# Patient Record
Sex: Female | Born: 1965 | Race: White | Hispanic: No | Marital: Married | Smoking: Never smoker
Health system: Southern US, Community
[De-identification: ages and names within clinical notes are randomized; demographics above are authoritative.]

## PROBLEM LIST (undated history)

## (undated) DIAGNOSIS — D6800 Von Willebrand disease, unspecified: Secondary | ICD-10-CM

## (undated) HISTORY — PX: ABDOMINAL HYSTERECTOMY: SHX81

## (undated) HISTORY — PX: KNEE SURGERY: SHX244

---

## 1998-03-12 ENCOUNTER — Inpatient Hospital Stay (HOSPITAL_COMMUNITY): Admission: AD | Admit: 1998-03-12 | Discharge: 1998-03-12 | Payer: Self-pay | Admitting: Obstetrics and Gynecology

## 1999-07-21 ENCOUNTER — Other Ambulatory Visit: Admission: RE | Admit: 1999-07-21 | Discharge: 1999-07-21 | Payer: Self-pay | Admitting: Obstetrics & Gynecology

## 2004-11-04 ENCOUNTER — Other Ambulatory Visit: Admission: RE | Admit: 2004-11-04 | Discharge: 2004-11-04 | Payer: Self-pay | Admitting: Obstetrics & Gynecology

## 2007-06-03 ENCOUNTER — Ambulatory Visit (HOSPITAL_COMMUNITY): Admission: RE | Admit: 2007-06-03 | Discharge: 2007-06-03 | Payer: Self-pay | Admitting: Obstetrics & Gynecology

## 2007-10-13 ENCOUNTER — Encounter (INDEPENDENT_AMBULATORY_CARE_PROVIDER_SITE_OTHER): Payer: Self-pay | Admitting: Obstetrics & Gynecology

## 2007-10-13 ENCOUNTER — Ambulatory Visit (HOSPITAL_COMMUNITY): Admission: RE | Admit: 2007-10-13 | Discharge: 2007-10-16 | Payer: Self-pay | Admitting: Obstetrics & Gynecology

## 2010-08-12 NOTE — H&P (Signed)
Sydney Bell, Sydney Bell             ACCOUNT NO.:  [id number]   MEDICAL RECORD NO.:  [id number]          PATIENT TYPE:  AMB   LOCATION:  SDC                           FACILITY:  [facility]   PHYSICIAN:  Freddy Finner, M.D.   DATE OF BIRTH:  1965-08-08   DATE OF ADMISSION:  10/13/2007  DATE OF DISCHARGE:                              HISTORY & PHYSICAL   ADMISSION DIAGNOSES:  1. Marked pelvic relaxation with relaxed vaginal outlet.  2. Second-degree cystocele.  3. Second-degree rectocele.  4. Significant uterine descent.  5. Clinical ultrasound and clinical history, findings most consistent      with adenomyosis.   Patient is a 45 year old white married female, gravida 4, para 3, who  has a very long history of menorrhagia, severe dysmenorrhea, recurring  ovarian cyst, intermittent pelvic pain.  She has symptoms of marked  pelvic pressure.  She does have some stress urinary symptoms.  She has a  very long history of very heavy menses.  She also has an intolerance of  oral contraceptives.  She has declined the use of an intrauterine  device.  She has declined endometrial ablation as a way of managing her  dysmenorrhea.   Her first plan to proceed with surgery dates back approximately 10  years, and she has been reluctant over the interval since that time to  be completely definitive with surgery, but her symptoms have reached the  point at this time.  She and her husband are planning no more children  since he has had a vasectomy.  She has now requested definitive surgical  intervention.  She is admitted now for a total vaginal hysterectomy,  anterior and posterior vaginal repair, sacrospinous ligament suspension  of the vagina.  The option of removing the ovaries have been discussed,  and the patient really prefers to keep her ovaries at this point in time  unless some intraoperative finding makes it appropriate to remove one or  both.   The potential risks of the procedure, including  injury to the bladder,  bowel, other intra-abdominal structures, the potential for hemorrhage,  infection, and additional surgical procedures in the event of  complication have been discussed.  The patient has reviewed the ACOG  booklet on this particular procedure and is now admitted and prepared to  proceed with surgery.   Her current review of systems is essentially negative except above.  There are no specific cardiopulmonary, GI, or other GU complaints.   Patient did have a minor operative procedure in February of this year as  an attempt to mediate her symptoms, and she was found on ultrasound to  have an endometrial polyp and thickening of the uterine wall consistent  with adenomyosis.  Because of the pain component of her symptoms, she  also had laparoscopy along with hysteroscopy, D&C, and resection of her  endometrial polyp in February.  She has failed to clinically respond and  is now admitted for more definitive surgery.  Additional operative  procedures include a laparoscopy in 1998 with essentially similar  findings.  She has had three vaginal deliveries.  She has had  a  tonsillectomy many years ago.  She has had previous laparoscopic  evaluation of her knee.  She is known to have von Willebrand's disease.  In the past, she was followed by Dr. Glenford Peers while he was here in  [facility] for vaginal deliveries.  He did not recommend additional  treatment for this other than in the event of hemorrhage, which did not  occur.  Certainly, von Willebrand's may be a contributing factor to her  menorrhagia.   She is currently on no medications on a regular basis.   She has no other medical conditions or illnesses.   She has no known allergies to medications.   She does not do cigarettes.   FAMILY HISTORY:  Noncontributory.  Family history is remarkable for the  complete absence of any history of ovarian cancer.   PHYSICAL EXAMINATION:  HEENT:  Grossly within normal  limits.  Thyroid gland is not palpably enlarged.  Blood pressure in the office 100/60.  Weight 119.  Height 5 feet 1-1/2  inches.  CHEST:  Clear to auscultation.  HEART:  Normal sinus rhythm without murmurs, rubs or gallops.  BREASTS:  Normal.  There are no palpable masses.  No skin change.  No  evidence of nipple discharge (mammogram in May, 2009 was normal).  ABDOMEN:  Soft, nontender without appreciable organomegaly or palpable  masses.  EXTREMITIES:  Without clubbing, cyanosis or edema.  PELVIC:  Basically described in the present illness.  On bimanual, the  uterus is enlarged.  There are no palpable adnexal masses.  On  rectovaginal exam, the rectum is palpably normal and confirms the pelvic  findings noted above.   ASSESSMENT:  1. Symptomatic pelvic relaxation.  2. Uterine enlargement with suspected adenomyosis.  3. Clinical symptoms of chronic pelvic pain, severe      dysmenorrhea/menorrhagia.  4. Pressure symptoms and stress urinary incontinence, consistent with      pelvic relaxation.   PLAN:  Total vaginal hysterectomy, anterior and posterior  colporrhaphies, sacral sling with spinous ligament suspension of vagina.      Freddy Finner, M.D.  Electronically Signed     WRN/MEDQ  D:  10/12/2007  T:  10/12/2007  Job:  [id number]

## 2010-08-12 NOTE — Op Note (Signed)
Sydney Bell, BALDO             ACCOUNT NO.:  [id number]   MEDICAL RECORD NO.:  [id number]          PATIENT TYPE:  OIB   LOCATION:  9315                          FACILITY:  [facility]   PHYSICIAN:  Freddy Finner, M.D.   DATE OF BIRTH:  [birth year]   DATE OF PROCEDURE:  10/13/2007  DATE OF DISCHARGE:                               OPERATIVE REPORT   PREOPERATIVE DIAGNOSES:  1. Symptomatic pelvic relaxation.  2. Uterine enlargement.  3. Persistent menorrhagia.  4. Suspected adenomyosis.   POSTOPERATIVE DIAGNOSES:  1. Symptomatic pelvic relaxation.  2. Uterine enlargement.  3. Persistent menorrhagia.  4. Suspected adenomyosis.   OPERATIVE PROCEDURE:  1. Total vaginal hysterectomy.  2. Anterior and posterior vaginal repair.  3. Sacrospinous ligament suspension of vagina.  4. Suprapubic banana catheter placement.   ESTIMATED INTRAOPERATIVE BLOOD LOSS:  150 mL.   ANESTHESIA:  General endotracheal.   INTRAOPERATIVE COMPLICATIONS:  None.   The patient was admitted on the morning of surgery.  She received an  antibiotic bolus preoperatively.  She was placed in PAS hose.  She was  brought to the operating room and there placed under adequate general  anesthesia and placed in the dorsolithotomy position using the [place]  stirrup system.  A Betadine prep of mons, perineum, vagina and medial  thighs was carried out with Betadine scrub, followed by Betadine  solution.  Sterile drapes were applied.  A posterior weighted vaginal  retractor was placed.  The cervix was grasped with a Christella Hartigan tenaculum.  Colpotomy incision was made while tenting the mucosa posterior to the  cervix.  The cervix was released from the vaginal mucosa with an  incision with a scalpel circumferentially.  The LigaSure system was then  used to seal and divide uterosacral pedicles and bladder pillars.  The  bladder was carefully advanced out of the cervix and lower segment.  Cardinal ligament pedicles were taken, sealed  and divided using the  LigaSure system.  Anterior peritoneum was entered.  Vessel pedicles were  then taken, sealed and divided using LigaSure.  An additional pedicle  was taken above the vessels on each side, sealed and divided.  Uterus  was delivered through the introitus.  The remaining pedicles, including  the utero-ovarian ligament, fallopian tube and round ligament were  sealed and divided with LigaSure.  This allowed delivery of the uterus  through the vaginal introitus.  The angles of the vagina were then  anchored to the uterosacrals using mattress sutures of -0 Monocryl.  The  posterior peritoneum was closed and the uterosacrals plicated using an  interrupted 0 Monocryl suture.  The cuff was closed vertically with  figure-of-eights of 0 Monocryl over the posterior two-thirds of the  vaginal cuff.  The edges of the mucosa anteriorly were then grasped with  Allis's.  The mucosa overlying the bladder was tented in the midline  with progressive blunt and sharp dissection.  An incision was made along  the midline of the vagina for a distance of approximately 6-7 cm up to a  level approximately 1/2 cm distal to the urethral meatus.  With very  careful blunt and sharp dissection, the bladder was freed from the  mucosa.  Interrupted 0 Monocryl sutures were then used to elevate the  urethrovesical angle and to reduce the cystocele by plicating the  urethrovesical fascia.  The urethral length was checked at approximately  2.5 cm and urethral patency was confirmed.  Segments of mucosa were  excised.  The cuff was completely closed with figure-of-eights of 0  Monocryl.  The anterior incision was closed with a running locking 2-0  Monocryl suture.  Attention was turned posteriorly.  The pyramidal  shaped segment of mucosa was excised from the perineal body after  grasping the fourchette on each side with Allis clamps.  Dissection of  the mucosa posteriorly was then carried out using a  technique  essentially identical to the anterior with blunt and sharp dissection,  developing an incision along the midline of the posterior vaginal  mucosa.  After dissecting out the rectum and perirectal tissues, a  sacrospinous ligament suspension suture was placed in the right  sacrospinous ligament using the Capio device and a Dexon suture.  It was  anchored to the vaginal mucosa approximately 1 cm distal to the anterior  cuff.  Plication sutures of 0 Monocryl were then used to reapproximate  perirectal supportive tissue and reconstruct the rectovaginal septum.  Levators were plicated with interrupted 0 Monocryl suture.  This also  effectively lengthened the perineal body, creating a greater vaginal  length.  Segments of mucosa were excised posteriorly.  The closure was  then started at the apex with a running 2-0 Monocryl suture and carried  for a distance of approximately 4-5 cm.  The sacrospinous ligament  suspension stitch was then tied with excellent elevation of the vaginal  apex and fixation of the vaginal apex.  The incision was then completely  closed in a fashion similar to episiotomy running along the mucosa  posteriorly and then a subcuticular suture along the perineal body.  The  case was very dry and there was no active bleeding on completion of the  procedure either before or after closure of the mucosa.  For that  reason, it was elected not to place a vaginal pack.  The bladder was  filled with approximately 500 mL of sterile irrigating solution.  The  banana catheter was then inserted without difficulty just above the  symphysis pubis and it was anchored to the skin with 0 nylon sutures.  This was connected to a collecting Foley bag.  The procedure was  terminated.  The instruments were removed.  The patient was taken to the  recovery room in good condition.      Freddy Finner, M.D.  Electronically Signed     WRN/MEDQ  D:  10/13/2007  T:  10/13/2007  Job:   [id number]

## 2010-08-15 NOTE — Discharge Summary (Signed)
NAMEKEARY, Sydney Bell             ACCOUNT NO.:  [id number]   MEDICAL RECORD NO.:  [id number]          PATIENT TYPE:  OIB   LOCATION:  9315                          FACILITY:  [facility]   PHYSICIAN:  Freddy Finner, M.D.   DATE OF BIRTH:  07-Oct-1965   DATE OF ADMISSION:  10/13/2007  DATE OF DISCHARGE:  10/16/2007                               DISCHARGE SUMMARY   DISCHARGE DIAGNOSIS:  Symptomatic pelvic relaxation with third-degree  cystocele, second-degree rectocele, and second-degree uterine prolapse.   HISTOLOGIC DIAGNOSES:  Uterine adenomyosis and leiomyomata.   OPERATIVE PROCEDURE:  Total vaginal hysterectomy, anterior and posterior  vaginal repair, and sacrospinous ligament suspension of vagina.   INTRAOPERATIVE AND POSTOPERATIVE COMPLICATIONS:  None.   DISPOSITION:  The patient was in satisfactory improved condition at the  time of her discharge.  She was still not having adequate bladder  evacuation and was discharged home with a suprapubic catheter and  instructions on managing in this and plan to return for removal of the  catheter.  She was given Percocet 5/325 to be taken as needed for  postoperative pain.  She is to avoid heavy lifting and vaginal entry.  She is to report fever, severe pain, or heavy vaginal bleeding.   Details of the present illness, past history, family history, review of  systems, and physical exam were recorded in the admission note.  Basically, the physical findings on admission note were the same as the  discharge diagnosis specifically cystocele prolapse, second-degree  uterine prolapse, second-degree rectocele, fibroids, and adenomyosis.   LABORATORY DATA:  During this admission includes normal CBC on admission  with 12.7 hemoglobin and 10.7 hemoglobin on the first postoperative day  admission.  Prothrombin time and PTT were normal.   HOSPITAL COURSE:  Following admission, the above-described surgical  procedure was accomplished after giving the  patient IV bolus of  antibiotic and PAS hose and prophylaxis during the procedure.  Her  postoperative course was without any significant complication.  She did  have somewhat slow return of ability to void with significant residuals.  By the third postop operative day, however, her other mild symptoms of  nausea and slight diet advancement were resolved.  She was tolerating a  regular diet.  She was ambulating without assistance.  She was  discharged home with further disposition as noted above.       Freddy Finner, M.D.  Electronically Signed     WRN/MEDQ  D:  11/11/2007  T:  11/12/2007  Job:  [id number]

## 2010-12-26 LAB — PROTIME-INR
INR: 1
Prothrombin Time: 13.5

## 2010-12-26 LAB — CBC
Hemoglobin: 12.7
MCHC: 33.7
MCHC: 33.7
Platelets: 210
Platelets: 272
RDW: 13.5
RDW: 13.6

## 2010-12-26 LAB — APTT: aPTT: 32

## 2016-01-02 ENCOUNTER — Other Ambulatory Visit: Payer: Self-pay | Admitting: Obstetrics & Gynecology

## 2016-01-02 DIAGNOSIS — R928 Other abnormal and inconclusive findings on diagnostic imaging of breast: Secondary | ICD-10-CM

## 2016-01-08 ENCOUNTER — Ambulatory Visit
Admission: RE | Admit: 2016-01-08 | Discharge: 2016-01-08 | Disposition: A | Discharge status: Home / Self Care | Payer: BLUE CROSS/BLUE SHIELD | Source: Ambulatory Visit | Attending: Obstetrics & Gynecology | Admitting: Obstetrics & Gynecology

## 2016-01-08 DIAGNOSIS — R928 Other abnormal and inconclusive findings on diagnostic imaging of breast: Secondary | ICD-10-CM

## 2018-04-27 ENCOUNTER — Observation Stay (HOSPITAL_COMMUNITY)
Admission: EM | Admit: 2018-04-27 | Discharge: 2018-04-28 | Disposition: A | Discharge status: Home / Self Care | Payer: 59 | Attending: Internal Medicine | Admitting: Internal Medicine

## 2018-04-27 ENCOUNTER — Other Ambulatory Visit: Payer: Self-pay

## 2018-04-27 ENCOUNTER — Encounter (HOSPITAL_COMMUNITY): Payer: Self-pay

## 2018-04-27 ENCOUNTER — Emergency Department (HOSPITAL_COMMUNITY): Payer: 59

## 2018-04-27 DIAGNOSIS — I479 Paroxysmal tachycardia, unspecified: Secondary | ICD-10-CM | POA: Diagnosis not present

## 2018-04-27 DIAGNOSIS — K449 Diaphragmatic hernia without obstruction or gangrene: Secondary | ICD-10-CM | POA: Diagnosis not present

## 2018-04-27 DIAGNOSIS — R7989 Other specified abnormal findings of blood chemistry: Secondary | ICD-10-CM | POA: Insufficient documentation

## 2018-04-27 DIAGNOSIS — Z79899 Other long term (current) drug therapy: Secondary | ICD-10-CM | POA: Diagnosis not present

## 2018-04-27 DIAGNOSIS — R002 Palpitations: Secondary | ICD-10-CM | POA: Insufficient documentation

## 2018-04-27 DIAGNOSIS — R079 Chest pain, unspecified: Secondary | ICD-10-CM

## 2018-04-27 DIAGNOSIS — R42 Dizziness and giddiness: Secondary | ICD-10-CM | POA: Diagnosis present

## 2018-04-27 DIAGNOSIS — R0789 Other chest pain: Secondary | ICD-10-CM | POA: Diagnosis present

## 2018-04-27 DIAGNOSIS — R778 Other specified abnormalities of plasma proteins: Secondary | ICD-10-CM

## 2018-04-27 LAB — TSH: TSH: 1.305 u[IU]/mL (ref 0.350–4.500)

## 2018-04-27 LAB — CBC
HCT: 48.6 % — ABNORMAL HIGH (ref 36.0–46.0)
HEMOGLOBIN: 14.4 g/dL (ref 12.0–15.0)
MCH: 29.6 pg (ref 26.0–34.0)
MCHC: 29.6 g/dL — AB (ref 30.0–36.0)
MCV: 99.8 fL (ref 80.0–100.0)
Platelets: 262 10*3/uL (ref 150–400)
RBC: 4.87 MIL/uL (ref 3.87–5.11)
RDW: 13 % (ref 11.5–15.5)
WBC: 8.1 10*3/uL (ref 4.0–10.5)
nRBC: 0 % (ref 0.0–0.2)

## 2018-04-27 LAB — BASIC METABOLIC PANEL
Anion gap: 10 (ref 5–15)
BUN: 16 mg/dL (ref 6–20)
CO2: 24 mmol/L (ref 22–32)
CREATININE: 0.89 mg/dL (ref 0.44–1.00)
Calcium: 9.6 mg/dL (ref 8.9–10.3)
Chloride: 106 mmol/L (ref 98–111)
GFR calc Af Amer: >60 mL/min (ref >60)
GLUCOSE: 118 mg/dL — AB (ref 70–99)
Potassium: 3.4 mmol/L — ABNORMAL LOW (ref 3.5–5.1)
SODIUM: 140 mmol/L (ref 135–145)

## 2018-04-27 LAB — POCT I-STAT TROPONIN I: TROPONIN I, POC: 0.13 ng/mL — AB (ref 0.00–0.08)

## 2018-04-27 MED ORDER — ASPIRIN 81 MG PO CHEW
324.0000 mg | CHEWABLE_TABLET | Freq: Once | ORAL | Status: AC
  Administered 2018-04-27: 324 mg via ORAL
  Filled 2018-04-27: qty 4

## 2018-04-27 MED ORDER — SODIUM CHLORIDE 0.9% FLUSH
3.0000 mL | Freq: Once | INTRAVENOUS | Status: AC
  Administered 2018-04-27: 3 mL via INTRAVENOUS

## 2018-04-27 NOTE — ED Notes (Addendum)
EKG shown to Dr. Allen 

## 2018-04-27 NOTE — ED Provider Notes (Signed)
[facility]-EMERGENCY DEPT Provider Note   CSN: [id number] Arrival date & time: 04/27/18  1944   History   Chief Complaint Chief Complaint  Patient presents with  . Palpitations  . Dizziness    HPI Sydney Bell is a 53 y.o. female with a PMH of Von Willebrand Disease presents with intermittent palpitations and dizziness onset 6 days ago. Patient reports nonradiating left sided chest discomfort that she describes as pressure. Patient describes dizziness as lightheadedness. Patient states nothing makes symptoms better or worse. Patient denies taking any medications for her symptoms. Patient denies shortness of breath, cough, recent travel, recent surgery, hx of DVT/PE, or leg edema/pain. Patient denies any previous cardiac problems or family history of heart problems. Patient denies nausea, vomiting, abdominal pain, or diaphoresis. Patient denies stress of anxiety.   HPI  History reviewed. No pertinent past medical history.  Patient Active Problem List   Diagnosis Date Noted  . Chest pain 04/27/2018    Past Surgical History:  Procedure Laterality Date  . ABDOMINAL HYSTERECTOMY       OB History   No obstetric history on file.      Home Medications    Prior to Admission medications   Medication Sig Start Date End Date Taking? Authorizing Provider  acidophilus (RISAQUAD) CAPS capsule Take 1 capsule by mouth daily.   Yes [provider]  Ascorbic Acid (VITAMIN C) 1000 MG tablet Take 1,000 mg by mouth daily.   Yes [provider]  calcium-vitamin D (OSCAL WITH D) 500-200 MG-UNIT tablet Take 1 tablet by mouth daily.   Yes [provider]  glucosamine-chondroitin 500-400 MG tablet Take 1 tablet by mouth daily.   Yes [provider]  Multiple Vitamin (MULTIVITAMIN WITH MINERALS) TABS tablet Take 1 tablet by mouth daily.   Yes [provider]    Family History History reviewed. No pertinent family  history.  Social History Social History   Tobacco Use  . Smoking status: Not on file  Substance Use Topics  . Alcohol use: Not on file  . Drug use: Not on file     Allergies   Patient has no known allergies.   Review of Systems Review of Systems  Constitutional: Negative for activity change, appetite change, chills, diaphoresis, fatigue, fever and unexpected weight change.  HENT: Negative for congestion and rhinorrhea.   Eyes: Negative for visual disturbance.  Respiratory: Negative for cough, chest tightness, shortness of breath and wheezing.   Cardiovascular: Positive for chest pain and palpitations. Negative for leg swelling.  Gastrointestinal: Negative for abdominal pain, nausea and vomiting.  Endocrine: Negative for cold intolerance and heat intolerance.  Musculoskeletal: Negative for back pain.  Skin: Negative for rash.  Allergic/Immunologic: Negative for immunocompromised state.  Neurological: Positive for dizziness and light-headedness. Negative for syncope, weakness and numbness.  Hematological: Bruises/bleeds easily.  Psychiatric/Behavioral: Negative for agitation and behavioral problems. The patient is not nervous/anxious.      Physical Exam Updated Vital Signs BP (!) 141/87 (BP Location: Right Arm)   Pulse (!) 117   Temp 98.6 F (37 C) (Oral)   Resp (!) 25   Ht 5' (1.524 m)   Wt 61.7 kg   SpO2 98%   BMI 26.56 kg/m   Physical Exam Vitals signs and nursing note reviewed.  Constitutional:      General: She is not in acute distress.    Appearance: She is well-developed. She is not diaphoretic.  HENT:     Head: Normocephalic and  atraumatic.  Neck:     Musculoskeletal: Normal range of motion and neck supple.     Vascular: No JVD.  Cardiovascular:     Rate and Rhythm: Regular rhythm. Tachycardia present.     Pulses: Normal pulses.          Radial pulses are 2+ on the right side and 2+ on the left side.       Dorsalis pedis pulses are 2+ on the right  side and 2+ on the left side.     Heart sounds: Normal heart sounds. No murmur. No friction rub. No gallop.   Pulmonary:     Effort: Pulmonary effort is normal. No respiratory distress.     Breath sounds: Normal breath sounds. No wheezing or rales.  Chest:     Chest wall: No tenderness.  Abdominal:     Palpations: Abdomen is soft.     Tenderness: There is no abdominal tenderness.  Musculoskeletal: Normal range of motion.        General: No swelling or tenderness.     Right lower leg: No edema.     Left lower leg: No edema.  Skin:    General: Skin is warm.     Capillary Refill: Capillary refill takes less than 2 seconds.     Coloration: Skin is not pale.     Findings: No rash.  Neurological:     Mental Status: She is alert and oriented to person, place, and time.    Mental Status:  Alert, oriented, thought content appropriate, able to give a coherent history. Speech fluent without evidence of aphasia. Able to follow 2 step commands without difficulty.  Cranial Nerves:  II:  Peripheral visual fields grossly normal, pupils equal, round, reactive to light III,IV, VI: ptosis not present, extra-ocular motions intact bilaterally  V,VII: smile symmetric, facial light touch sensation equal VIII: hearing grossly normal to voice  X: uvula elevates symmetrically  XI: bilateral shoulder shrug symmetric and strong XII: midline tongue extension without fassiculations Motor:  Normal tone. 5/5 in upper and lower extremities bilaterally including strong and equal grip strength and dorsiflexion/plantar flexion Sensory: Pinprick and light touch normal in all extremities.  Deep Tendon Reflexes: 2+ and symmetric in the biceps and patella Cerebellar: normal finger-to-nose with bilateral upper extremities Gait: normal gait and balance.  CV: distal pulses palpable throughout    ED Treatments / Results  Labs (all labs ordered are listed, but only abnormal results are displayed) Labs Reviewed   BASIC METABOLIC PANEL - Abnormal; Notable for the following components:      Result Value   Potassium 3.4 (*)    Glucose, Bld 118 (*)    All other components within normal limits  CBC - Abnormal; Notable for the following components:   HCT 48.6 (*)    MCHC 29.6 (*)    All other components within normal limits  POCT I-STAT TROPONIN I - Abnormal; Notable for the following components:   Troponin i, poc 0.13 (*)    All other components within normal limits  TSH  I-STAT TROPONIN, ED  I-STAT BETA HCG BLOOD, ED ([facility], [facility], [facility] ONLY)    EKG EKG Interpretation  Date/Time:  Wednesday April 27 2018 20:05:36 EST Ventricular Rate:  112 PR Interval:    QRS Duration: 98 QT Interval:  317 QTC Calculation: 433 R Axis:   42 Text Interpretation:  Sinus tachycardia Low voltage, precordial leads Confirmed by Lorre Nick ([id number]) on 04/27/2018 9:01:24 PM   Radiology Dg Chest  2 View  Result Date: 04/27/2018 CLINICAL DATA:  Chest pain. Intermittent palpitations. EXAM: CHEST - 2 VIEW COMPARISON:  None. FINDINGS: The cardiomediastinal contours are normal. The lungs are clear. Pulmonary vasculature is normal. No consolidation, pleural effusion, or pneumothorax. Minimum thoracolumbar scoliotic curvature. No acute osseous abnormalities are seen. IMPRESSION: No acute chest findings. Electronically Signed   By: Narda RutherfordMelanie  Sanford M.D.   On: 04/27/2018 20:45    Procedures Procedures (including critical care time)  Medications Ordered in ED Medications  sodium chloride flush (NS) 0.9 % injection 3 mL (3 mLs Intravenous Given 04/27/18 2238)  aspirin chewable tablet 324 mg (324 mg Oral Given 04/27/18 2224)     Initial Impression / Assessment and Plan / ED Course  I have reviewed the triage vital signs and the nursing notes.  Pertinent labs & imaging results that were available during my care of the patient were reviewed by me and considered in my medical decision making (see chart for details).     Concern for cardiac etiology of Chest Pain and palpitations. EKG reveals sinus tachycardia and low voltage. First troponin elevated at 0.13. Cardiology has been consulted and has agreed to evaluate patient in the morning. Cardiology recommends CTA to evaluate for pulmonary embolism and serial troponin/EKGs. [facility] if CTA is negative. Cardiology discussed heparin, but advised caution due to von willebrand disease. This case was discussed with Dr. Freida BusmanAllen who has seen the patient and agrees with plan to admit. Consulted hospitalist and hospitalist has agreed to admit the patient.   Final Clinical Impressions(s) / ED Diagnoses   Final diagnoses:  Palpitations  Left-sided chest pain  Elevated troponin    ED Discharge Orders    None       Glade StanfordHernandez, Tajuana Kniskern P, PA-C 04/27/18 2352    Lorre NickAllen, Anthony, MD 04/29/18 [phone]

## 2018-04-27 NOTE — ED Notes (Signed)
Unable to collect labs at this time patient is in xray 

## 2018-04-27 NOTE — ED Triage Notes (Signed)
Pt reports that she has been having intermittent palpitations since Friday night. Pt reports some dizziness today. Pt reports going to UC today and they told her that she has an elevated heart rate and to just follow-up with PCP. PCP called after receiving records and told her to come to ED to get checked.

## 2018-04-27 NOTE — ED Provider Notes (Addendum)
Medical screening examination/treatment/procedure(s) were conducted as a shared visit with non-physician practitioner(s) and myself.  I personally evaluated the patient during the encounter.  EKG Interpretation  Date/Time:  Wednesday April 27 2018 20:05:36 EST Ventricular Rate:  112 PR Interval:    QRS Duration: 98 QT Interval:  317 QTC Calculation: 433 R Axis:   42 Text Interpretation:  Sinus tachycardia Low voltage, precordial leads Confirmed by Lorre Nick ([id number]) on 04/27/2018 9:73:52 PM 53 year old female presents with palpitations times several days.  States heart rate is been 125.  States that it seems to be irregular at times and she had some slight chest discomfort.  Denies any associated diaphoresis or dyspnea.  No exertional components.  Has EKG does show sinus tach here.  She has elevated troponin which is likely from demand ischemia.  She has no prior history of thyroid issues.  Will consult cardiology and admit to the medicine service for further evaluation   Lorre Nick, MD 04/27/18 2215    Lorre Nick, MD 04/27/18 2215

## 2018-04-28 ENCOUNTER — Observation Stay (HOSPITAL_COMMUNITY): Payer: 59

## 2018-04-28 ENCOUNTER — Other Ambulatory Visit: Payer: Self-pay | Admitting: Student

## 2018-04-28 ENCOUNTER — Encounter (HOSPITAL_COMMUNITY): Payer: Self-pay | Admitting: Internal Medicine

## 2018-04-28 DIAGNOSIS — R002 Palpitations: Secondary | ICD-10-CM

## 2018-04-28 DIAGNOSIS — R7989 Other specified abnormal findings of blood chemistry: Secondary | ICD-10-CM | POA: Diagnosis not present

## 2018-04-28 DIAGNOSIS — K449 Diaphragmatic hernia without obstruction or gangrene: Secondary | ICD-10-CM | POA: Diagnosis not present

## 2018-04-28 DIAGNOSIS — I479 Paroxysmal tachycardia, unspecified: Secondary | ICD-10-CM | POA: Diagnosis not present

## 2018-04-28 DIAGNOSIS — R42 Dizziness and giddiness: Secondary | ICD-10-CM | POA: Diagnosis present

## 2018-04-28 DIAGNOSIS — Z79899 Other long term (current) drug therapy: Secondary | ICD-10-CM | POA: Diagnosis not present

## 2018-04-28 DIAGNOSIS — R079 Chest pain, unspecified: Secondary | ICD-10-CM

## 2018-04-28 DIAGNOSIS — R0789 Other chest pain: Secondary | ICD-10-CM | POA: Diagnosis present

## 2018-04-28 LAB — CBC
HCT: 38.3 % (ref 36.0–46.0)
Hemoglobin: 12.3 g/dL (ref 12.0–15.0)
MCH: 30 pg (ref 26.0–34.0)
MCHC: 32.1 g/dL (ref 30.0–36.0)
MCV: 93.4 fL (ref 80.0–100.0)
Platelets: 272 10*3/uL (ref 150–400)
RBC: 4.1 MIL/uL (ref 3.87–5.11)
RDW: 13.1 % (ref 11.5–15.5)
WBC: 6.3 10*3/uL (ref 4.0–10.5)
nRBC: 0 % (ref 0.0–0.2)

## 2018-04-28 LAB — CREATININE, SERUM
CREATININE: 0.94 mg/dL (ref 0.44–1.00)
GFR calc Af Amer: >60 mL/min (ref >60)
GFR calc non Af Amer: >60 mL/min (ref >60)

## 2018-04-28 LAB — TROPONIN I: Troponin I: <0.03 ng/mL (ref <0.03)

## 2018-04-28 LAB — HIV ANTIBODY (ROUTINE TESTING W REFLEX): HIV SCREEN 4TH GENERATION: NONREACTIVE

## 2018-04-28 LAB — MAGNESIUM: Magnesium: 2.3 mg/dL (ref 1.7–2.4)

## 2018-04-28 LAB — POCT I-STAT TROPONIN I: Troponin i, poc: 0 ng/mL (ref 0.00–0.08)

## 2018-04-28 MED ORDER — ENOXAPARIN SODIUM 40 MG/0.4ML ~~LOC~~ SOLN: 40.0000 mg | SUBCUTANEOUS | Status: DC

## 2018-04-28 MED ORDER — IOPAMIDOL (ISOVUE-370) INJECTION 76%
INTRAVENOUS | Status: AC
  Filled 2018-04-28: qty 100

## 2018-04-28 MED ORDER — METOPROLOL TARTRATE 12.5 MG HALF TABLET
12.5000 mg | ORAL_TABLET | Freq: Two times a day (BID) | ORAL | Status: DC
  Administered 2018-04-28: 12.5 mg via ORAL
  Filled 2018-04-28: qty 1

## 2018-04-28 MED ORDER — IOPAMIDOL (ISOVUE-370) INJECTION 76%
100.0000 mL | Freq: Once | INTRAVENOUS | Status: AC | PRN
  Administered 2018-04-28: 100 mL via INTRAVENOUS

## 2018-04-28 MED ORDER — ONDANSETRON HCL 4 MG/2ML IJ SOLN: 4.0000 mg | Freq: Four times a day (QID) | INTRAMUSCULAR | Status: DC | PRN

## 2018-04-28 MED ORDER — ACETAMINOPHEN 325 MG PO TABS: 650.0000 mg | ORAL_TABLET | ORAL | Status: DC | PRN

## 2018-04-28 MED ORDER — ASPIRIN EC 325 MG PO TBEC
325.0000 mg | DELAYED_RELEASE_TABLET | Freq: Every day | ORAL | Status: DC
  Administered 2018-04-28: 325 mg via ORAL
  Filled 2018-04-28: qty 1

## 2018-04-28 MED ORDER — METOPROLOL SUCCINATE ER 25 MG PO TB24: 12.5000 mg | ORAL_TABLET | Freq: Every day | ORAL | Status: DC

## 2018-04-28 MED ORDER — SODIUM CHLORIDE (PF) 0.9 % IJ SOLN
INTRAMUSCULAR | Status: AC
  Filled 2018-04-28: qty 50

## 2018-04-28 MED ORDER — METOPROLOL SUCCINATE ER 25 MG PO TB24: 12.5000 mg | ORAL_TABLET | Freq: Every day | ORAL | 0 refills | Status: DC

## 2018-04-28 NOTE — H&P (Signed)
History and Physical    Sydney Bell Mcgranahan BJY:[id number] DOB: 04/16/1965 DOA: 04/27/2018  PCP: System, Pcp Not In  Patient coming from: Home.  Chief Complaint: Palpitations and chest pressure.  HPI: Sydney Bell Sydney Bell is a 53 y.o. female with no significant past medical history presents to the ER with complaints of having palpitation and chest pressure.  Patient has been having the symptoms for last 3 to 4 days.  Patient gets brief episodes of palpitation associated with some chest pressure.  Relieved without any intervention.  Since symptoms became more persistent yesterday patient decided to come to the ER.  Denies any shortness of breath fever chills productive cough nausea vomiting abdominal pain or diarrhea.  ED Course: In the ER patient was having sinus tachycardia heart rate around 120 bpm.  EKG was showing sinus tachycardia.  Point-of-care troponin initially was positive.  Chest x-ray unremarkable.  ER physician discussed with on-call cardiologist who recommended admission for observation and get a CT angiogram of the chest.  CT angiogram of the chest is negative for PE.  TSH is pending.  Patient admitted for further observation.  Review of Systems: As per HPI, rest all negative.   History reviewed. No pertinent past medical history.  Past Surgical History:  Procedure Laterality Date  . ABDOMINAL HYSTERECTOMY    . KNEE SURGERY       reports that she has never smoked. She has never used smokeless tobacco. No history on file for alcohol and drug.  No Known Allergies  Family History  Problem Relation Age of Onset  . CAD Paternal Grandfather   . Prostate cancer Paternal Uncle     Prior to Admission medications   Medication Sig Start Date End Date Taking? Authorizing Provider  acidophilus (RISAQUAD) CAPS capsule Take 1 capsule by mouth daily.   Yes [provider]  Ascorbic Acid (VITAMIN C) 1000 MG tablet Take 1,000 mg by mouth daily.   Yes [provider]    calcium-vitamin D (OSCAL WITH D) 500-200 MG-UNIT tablet Take 1 tablet by mouth daily.   Yes [provider]  glucosamine-chondroitin 500-400 MG tablet Take 1 tablet by mouth daily.   Yes [provider]  Multiple Vitamin (MULTIVITAMIN WITH MINERALS) TABS tablet Take 1 tablet by mouth daily.   Yes [provider]    Physical Exam: Vitals:   04/27/18 2300 04/28/18 0003 04/28/18 0030 04/28/18 0100  BP: (!) 141/87  (!) 126/93 132/87  Pulse: (!) 117 97 86 91  Resp: (!) 25 (!) 21 13 (!) 22  Temp:      TempSrc:      SpO2: 98% 99% 98% 98%  Weight:      Height:          Constitutional: Moderately built and nourished. Vitals:   04/27/18 2300 04/28/18 0003 04/28/18 0030 04/28/18 0100  BP: (!) 141/87  (!) 126/93 132/87  Pulse: (!) 117 97 86 91  Resp: (!) 25 (!) 21 13 (!) 22  Temp:      TempSrc:      SpO2: 98% 99% 98% 98%  Weight:      Height:       Eyes: Anicteric no pallor. ENMT: No discharge from the ears eyes nose or mouth. Neck: No mass felt.  No neck rigidity. Respiratory: No rhonchi or crepitations. Cardiovascular: S1-S2 heard. Abdomen: Soft nontender bowel sounds present. Musculoskeletal: No edema.  No joint effusion. Skin: No rash. Neurologic: Alert awake oriented to time place and person.  Moves all extremities. Psychiatric: Appears normal.  Normal affect.   Labs on Admission: I have personally reviewed following labs and imaging studies  CBC: Recent Labs  Lab 04/27/18 2040  WBC 8.1  HGB 14.4  HCT 48.6*  MCV 99.8  PLT 262   Basic Metabolic Panel: Recent Labs  Lab 04/27/18 2040  NA 140  K 3.4*  CL 106  CO2 24  GLUCOSE 118*  BUN 16  CREATININE 0.89  CALCIUM 9.6   GFR: Estimated Creatinine Clearance: 59.3 mL/min (by C-G formula based on SCr of 0.89 mg/dL). Liver Function Tests: No results for input(s): AST, ALT, ALKPHOS, BILITOT, PROT, ALBUMIN in the last 168 hours. No results for input(s): LIPASE, AMYLASE in the last 168  hours. No results for input(s): AMMONIA in the last 168 hours. Coagulation Profile: No results for input(s): INR, PROTIME in the last 168 hours. Cardiac Enzymes: No results for input(s): CKTOTAL, CKMB, CKMBINDEX, TROPONINI in the last 168 hours. BNP (last 3 results) No results for input(s): PROBNP in the last 8760 hours. HbA1C: No results for input(s): HGBA1C in the last 72 hours. CBG: No results for input(s): GLUCAP in the last 168 hours. Lipid Profile: No results for input(s): CHOL, HDL, LDLCALC, TRIG, CHOLHDL, LDLDIRECT in the last 72 hours. Thyroid Function Tests: Recent Labs    04/27/18 2227  TSH 1.305   Anemia Panel: No results for input(s): VITAMINB12, FOLATE, FERRITIN, TIBC, IRON, RETICCTPCT in the last 72 hours. Urine analysis: No results found for: COLORURINE, APPEARANCEUR, LABSPEC, PHURINE, GLUCOSEU, HGBUR, BILIRUBINUR, KETONESUR, PROTEINUR, UROBILINOGEN, NITRITE, LEUKOCYTESUR Sepsis Labs: @LABRCNTIP (procalcitonin:4,lacticidven:4) )No results found for this or any previous visit (from the past 240 hour(s)).   Radiological Exams on Admission: Dg Chest 2 View  Result Date: 04/27/2018 CLINICAL DATA:  Chest pain. Intermittent palpitations. EXAM: CHEST - 2 VIEW COMPARISON:  None. FINDINGS: The cardiomediastinal contours are normal. The lungs are clear. Pulmonary vasculature is normal. No consolidation, pleural effusion, or pneumothorax. Minimum thoracolumbar scoliotic curvature. No acute osseous abnormalities are seen. IMPRESSION: No acute chest findings. Electronically Signed   By: Narda Rutherford M.D.   On: 04/27/2018 20:45   Ct Angio Chest Pe W/cm &/or Wo Cm  Result Date: 04/28/2018 CLINICAL DATA:  53 y/o  F; intermittent palpitations for 5 days. EXAM: CT ANGIOGRAPHY CHEST WITH CONTRAST TECHNIQUE: Multidetector CT imaging of the chest was performed using the standard protocol during bolus administration of intravenous contrast. Multiplanar CT image reconstructions and  MIPs were obtained to evaluate the vascular anatomy. CONTRAST:  ISOVUE-370 IOPAMIDOL (ISOVUE-370) INJECTION 76% COMPARISON:  04/27/2018 chest radiograph. FINDINGS: Cardiovascular: Satisfactory opacification of the pulmonary arteries to the segmental level. No evidence of pulmonary embolism. Normal heart size. No pericardial effusion. Mediastinum/Nodes: No enlarged mediastinal, hilar, or axillary lymph nodes. Thyroid gland, trachea, and esophagus demonstrate no significant findings. Lungs/Pleura: Lungs are clear. No pleural effusion or pneumothorax. Upper Abdomen: Small hiatal hernia. Multiple stable liver cysts. Musculoskeletal: No chest wall abnormality. No acute or significant osseous findings. Review of the MIP images confirms the above findings. IMPRESSION: 1. No pulmonary embolus identified.  Clear lungs. 2. Small hiatal hernia. Electronically Signed   By: Mitzi Hansen M.D.   On: 04/28/2018 02:23    EKG: Independently reviewed.  Sinus tachycardia.  Assessment/Plan Principal Problem:   Chest pain Active Problems:   Palpitations    1. Chest pain -happens during palpitations.  Presently chest pain-free.  Point-of-care troponin initially was positive.  We will cycle cardiac markers.  Aspirin.  Cardiology notified.  2. Palpitations -CT angiogram of the chest is negative.  Check TSH.  Continue to monitor in telemetry.  Will follow cardiology recommendations.   DVT prophylaxis: Lovenox. Code Status: Full code. Family Communication: Discussed with patient. Disposition Plan: Home. Consults called: Cardiology. Admission status: Observation.   Eduard ClosArshad N Morganne Haile MD [facility] Pager [phone].  If 7PM-7AM, please contact night-coverage [email or web address] Password [email or web address]  04/28/2018, 2:41 AM

## 2018-04-28 NOTE — ED Notes (Signed)
Patient transported to CT 

## 2018-04-28 NOTE — Progress Notes (Signed)
Patient is a 59 old female with no significant past medical history who presented to the emergency room with complaints of palpitation and chest pressure.  She was having the symptoms for last 3 to 4 days.  She denies having these symptoms while exertion.  The symptoms became more persistent so she decided to come to the emergency department.  In the emergency department she was found to have sinus tachycardia with heart rate around 120/min.  EKG just showed sinus tachycardia.  Point-of-care troponin was negative.  Chest x-ray unremarkable.  Cardiology was consulted and recommended CT angio which was negative.  TSH normal. Her troponins have been negative. Patient seen and examined the bedside this morning.  Remains comfortable.  Hemodynamically stable.  Denies any chest pain, palpitations or pressure.  Her heart rate has been remained stable. She is waiting for a cardiology evaluation today. Patient seen by Dr. Toniann Fail this morning

## 2018-04-28 NOTE — Progress Notes (Signed)
Patient waiting to eat, MD paged and a diet order has been placed.

## 2018-04-28 NOTE — Discharge Summary (Signed)
Physician Discharge Summary  Sydney Bell MWN:[id number] DOB: 11/04/1965 DOA: 04/27/2018  PCP: System, Pcp Not In  Admit date: 04/27/2018 Discharge date: 04/28/2018  Admitted From: Home Disposition:  Home  Discharge Condition:Stable CODE STATUS:FULL Diet recommendation:Regular  Brief/Interim Summary:  Patient is a 53 old female with no significant past medical history who presented to the emergency room with complaints of palpitation and chest pressure.  She was having the symptoms for last 3 to 4 days.  She denies having these symptoms while exertion.  The symptoms became more persistent so she decided to come to the emergency department.  In the emergency department she was found to have sinus tachycardia with heart rate around 120/min.  EKG just showed sinus tachycardia.  Point-of-care troponin was negative.  Chest x-ray unremarkable. Cardiology was consulted and recommended CT angio which was negative.  TSH normal. Her troponins have been negative. Patient seen and examined the bedside this morning.  Remains comfortable.  Hemodynamically stable.  Denies any chest pain, palpitations or pressure.  Her heart rate remained stable. She was seen by cardiology after admission.  Cardiology concluded that she has paroxysmal tachycardia.  She was started on low-dose Toprol.  Aspirin discontinued due to history of all advanced disease.  Cardiology cleared her for discharge and arranged an outpatient follow-up.   Discharge Diagnoses:  Principal Problem:   Left-sided chest pain Active Problems:   Palpitations    Discharge Instructions  Discharge Instructions    Diet general   Complete by:  As directed    Discharge instructions   Complete by:  As directed    1)Take prescribed medication as instructed. 2)Follow up with cardiology as an outpatient. You have an appointment on 05/19/18 at 9 am. 3)Follow up with your PCP in  a week.   Increase activity slowly   Complete by:  As directed       Allergies as of 04/28/2018   No Known Allergies     Medication List    TAKE these medications   acidophilus Caps capsule Take 1 capsule by mouth daily.   calcium-vitamin D 500-200 MG-UNIT tablet Commonly known as:  OSCAL WITH D Take 1 tablet by mouth daily.   glucosamine-chondroitin 500-400 MG tablet Take 1 tablet by mouth daily.   metoprolol succinate 25 MG 24 hr tablet Commonly known as:  TOPROL-XL Take 0.5 tablets (12.5 mg total) by mouth daily. Start taking on:  April 29, 2018   multivitamin with minerals Tabs tablet Take 1 tablet by mouth daily.   vitamin C 1000 MG tablet Take 1,000 mg by mouth daily.      Follow-up Information    [facility] [address] Follow up.   Specialty:  Cardiology Why:  You have appointments scheduled on 05/03/2018 for an echocardiogram and to get your heart monitor set up. Please arrive at 3:15pm to get checked in. Contact information: [address], [address] [place] [address] [phone]       [facility] Follow up.   Specialty:  Cardiology Why:  You have a follow-up appointment scheduled for 05/19/18 at 9:00am with Azalee Course, one of Dr. Blanchie Dessert PAs. Contact information: [address] [place] [address] [phone]         No Known Allergies  Consultations:  Cardiology   Procedures/Studies: Dg Chest 2 View  Result Date: 04/27/2018 CLINICAL DATA:  Chest pain. Intermittent palpitations. EXAM: CHEST - 2 VIEW COMPARISON:  None. FINDINGS: The cardiomediastinal contours are normal.  The lungs are clear. Pulmonary vasculature is normal. No consolidation, pleural effusion, or pneumothorax. Minimum thoracolumbar scoliotic curvature. No acute osseous abnormalities are seen. IMPRESSION: No acute chest findings. Electronically Signed   By: Narda RutherfordMelanie  Sanford M.D.   On: 04/27/2018 20:45   Ct Angio Chest Pe W/cm &/or Wo Cm  Result Date: 04/28/2018 CLINICAL DATA:   53 y/o  F; intermittent palpitations for 5 days. EXAM: CT ANGIOGRAPHY CHEST WITH CONTRAST TECHNIQUE: Multidetector CT imaging of the chest was performed using the standard protocol during bolus administration of intravenous contrast. Multiplanar CT image reconstructions and MIPs were obtained to evaluate the vascular anatomy. CONTRAST:  100mL ISOVUE-370 IOPAMIDOL (ISOVUE-370) INJECTION 76% COMPARISON:  04/27/2018 chest radiograph. FINDINGS: Cardiovascular: Satisfactory opacification of the pulmonary arteries to the segmental level. No evidence of pulmonary embolism. Normal heart size. No pericardial effusion. Mediastinum/Nodes: No enlarged mediastinal, hilar, or axillary lymph nodes. Thyroid gland, trachea, and esophagus demonstrate no significant findings. Lungs/Pleura: Lungs are clear. No pleural effusion or pneumothorax. Upper Abdomen: Small hiatal hernia. Multiple stable liver cysts. Musculoskeletal: No chest wall abnormality. No acute or significant osseous findings. Review of the MIP images confirms the above findings. IMPRESSION: 1. No pulmonary embolus identified.  Clear lungs. 2. Small hiatal hernia. Electronically Signed   By: Mitzi HansenLance  Furusawa-Stratton M.D.   On: 04/28/2018 02:23       Subjective: Patient seen and examined at bedside this morning.  Remains comfortable.  No chest pressure or pain.  Stable for discharge.  Discharge Exam: Vitals:   04/28/18 1120 04/28/18 1459  BP: 128/80 124/83  Pulse: 80 93  Resp: 20   Temp: 98 F (36.7 C)   SpO2: 100%    Vitals:   04/28/18 0447 04/28/18 0818 04/28/18 1120 04/28/18 1459  BP: (!) 144/88 126/84 128/80 124/83  Pulse: 86 84 80 93  Resp: 20 20 20    Temp: 98.3 F (36.8 C) 98.1 F (36.7 C) 98 F (36.7 C)   TempSrc: Oral Oral Oral   SpO2: 100% 99% 100%   Weight:      Height:        General: Pt is alert, awake, not in acute distress Cardiovascular: RRR, S1/S2 +, no rubs, no gallops Respiratory: CTA bilaterally, no wheezing, no  rhonchi Abdominal: Soft, NT, ND, bowel sounds + Extremities: no edema, no cyanosis    The results of significant diagnostics from this hospitalization (including imaging, microbiology, ancillary and laboratory) are listed below for reference.     Microbiology: No results found for this or any previous visit (from the past 240 hour(s)).   Labs: BNP (last 3 results) No results for input(s): BNP in the last 8760 hours. Basic Metabolic Panel: Recent Labs  Lab 04/27/18 2040 04/28/18 0239 04/28/18 1448  NA 140  --   --   K 3.4*  --   --   CL 106  --   --   CO2 24  --   --   GLUCOSE 118*  --   --   BUN 16  --   --   CREATININE 0.89 0.94  --   CALCIUM 9.6  --   --   MG  --   --  2.3   Liver Function Tests: No results for input(s): AST, ALT, ALKPHOS, BILITOT, PROT, ALBUMIN in the last 168 hours. No results for input(s): LIPASE, AMYLASE in the last 168 hours. No results for input(s): AMMONIA in the last 168 hours. CBC: Recent Labs  Lab 04/27/18 2040 04/28/18 0239  WBC 8.1 6.3  HGB 14.4 12.3  HCT 48.6* 38.3  MCV 99.8 93.4  PLT 262 272   Cardiac Enzymes: Recent Labs  Lab 04/28/18 0239  TROPONINI <0.03   BNP: Invalid input(s): POCBNP CBG: No results for input(s): GLUCAP in the last 168 hours. D-Dimer No results for input(s): DDIMER in the last 72 hours. Hgb A1c No results for input(s): HGBA1C in the last 72 hours. Lipid Profile No results for input(s): CHOL, HDL, LDLCALC, TRIG, CHOLHDL, LDLDIRECT in the last 72 hours. Thyroid function studies Recent Labs    04/27/18 2227  TSH 1.305   Anemia work up No results for input(s): VITAMINB12, FOLATE, FERRITIN, TIBC, IRON, RETICCTPCT in the last 72 hours. Urinalysis No results found for: COLORURINE, APPEARANCEUR, LABSPEC, PHURINE, GLUCOSEU, HGBUR, BILIRUBINUR, KETONESUR, PROTEINUR, UROBILINOGEN, NITRITE, LEUKOCYTESUR Sepsis Labs Invalid input(s): PROCALCITONIN,  WBC,  LACTICIDVEN Microbiology No results found for  this or any previous visit (from the past 240 hour(s)).  Please note: You were cared for by a hospitalist during your hospital stay. Once you are discharged, your primary care physician will handle any further medical issues. Please note that NO REFILLS for any discharge medications will be authorized once you are discharged, as it is imperative that you return to your primary care physician (or establish a relationship with a primary care physician if you do not have one) for your post hospital discharge needs so that they can reassess your need for medications and monitor your lab values.    Time coordinating discharge: 40 minutes  SIGNED:   Burnadette PopAmrit Declin Rajan, MD  [facility] 04/28/2018, 3:50 PM Pager [phone]  If 7PM-7AM, please contact night-coverage [email or web address] Password [email or web address]

## 2018-04-28 NOTE — Plan of Care (Signed)
  Problem: Education: Goal: Knowledge of General Education information will improve Description Including pain rating scale, medication(s)/side effects and non-pharmacologic comfort measures Outcome: Progressing   Problem: Health Behavior/Discharge Planning: Goal: Ability to manage health-related needs will improve Outcome: Progressing   

## 2018-04-28 NOTE — ED Notes (Signed)
[facility] called for transport. 

## 2018-04-28 NOTE — Progress Notes (Signed)
Patient ready for discharge. 

## 2018-04-28 NOTE — Consult Note (Addendum)
Cardiology Consult    Patient ID: Sydney Bell MRN: [id number], DOB/AGE: 55/05/1965   Admit date: 04/27/2018 Date of Consult: 04/28/2018  Primary Physician: PCP not listed in system. Primary Cardiologist: New to [facility] (Dr. Rennis GoldenHilty) Requesting Provider: Burnadette PopAdhikari, Amrit, MD  Patient Profile    Sydney DelayMarlene R Bell is a 53 y.o. female with a history of von Willebrand disease but no known cardiac history who is being seen today for the evaluation of chest pain at the request of Dr. Meribeth MattesAhhikari.  History of Present Illness    Mr. Sydney GoslingHawkins is a 53 year old female with no known cardiac history. She has never seen a Cardiologist or had a cardiac work-up. Patient has been in her usual state of health until last Friday (04/22/2018) when she woke up from sleep with her heart pounding. She put on her Apple Watch and heart rate was 130 bpm. This lasted for a couple of minutes and then resolved. However, this occurred several times throughout the night making it difficult for the patient to sleep. Patient continued to have intermittent episodes where heart rate was in the 120's throughout the day Saturday. She reports some occasional mild non-radiating left-sided chest pressure and lightheadedness/dizziness with the palpitations. No diaphoresis, shortness of breath, nausea, or vomiting. Patient has never had chest pain prior to this and denies any exertional symptoms.  Patient was doing "okay" from Sunday to Tuesday. However she woke up again on Tuesday night with palpitations. She called her PCP yesterday who advised her to go to a Urgent Care. The Urgent Care ran and EKG and sent in back to the PCP's office. PCP told patient that if symptoms continued she should go to the ED for further evaluation. Patient was at dinner last night when palpitations returned and she had more lightheadedness so she came to the ED.    In the ED: EKG showed sinus tachycardia with heart rate of 112 bpm. Initial I-stat troponin elevated  at 0.13. Repeat I-stat troponin negative. Chest x-ray showed no acute findings. Chest CTA negative showed no evidence of pulmonary embolism. CBC unremarkable. Na 140, K 3.4, Glucose 118, SCr 0.89. TSH normal. Patient was admitted for further evaluation.  Patient has continued to have intermittent palpitations while here. She had another episode that woke her up from sleep early this morning.    Past Medical History   History reviewed. No pertinent past medical history.  Past Surgical History:  Procedure Laterality Date  . ABDOMINAL HYSTERECTOMY    . KNEE SURGERY       Allergies  No Known Allergies  Inpatient Medications    . aspirin EC  325 mg Oral Daily  . enoxaparin (LOVENOX) injection  40 mg Subcutaneous Q24H    Family History    Family History  Problem Relation Age of Onset  . CAD Paternal Grandfather        "small heart attack" at the age of 55  . Prostate cancer Paternal Uncle    She indicated that the status of her paternal grandfather is unknown. She indicated that the status of her paternal uncle is unknown.   Social History    Social History   Socioeconomic History  . Marital status: Married    Spouse name: Not on file  . Number of children: Not on file  . Years of education: Not on file  . Highest education level: Not on file  Occupational History  . Not on file  Social Needs  . Financial resource strain:  Not on file  . Food insecurity:    Worry: Not on file    Inability: Not on file  . Transportation needs:    Medical: Not on file    Non-medical: Not on file  Tobacco Use  . Smoking status: Never Smoker  . Smokeless tobacco: Never Used  Substance and Sexual Activity  . Alcohol use: Not on file  . Drug use: Not on file  . Sexual activity: Not on file  Lifestyle  . Physical activity:    Days per week: Not on file    Minutes per session: Not on file  . Stress: Not on file  Relationships  . Social connections:    Talks on phone: Not on file      Gets together: Not on file    Attends religious service: Not on file    Active member of club or organization: Not on file    Attends meetings of clubs or organizations: Not on file    Relationship status: Not on file  . Intimate partner violence:    Fear of current or ex partner: Not on file    Emotionally abused: Not on file    Physically abused: Not on file    Forced sexual activity: Not on file  Other Topics Concern  . Not on file  Social History Narrative  . Not on file     Review of Systems    Review of Systems  Constitutional: Negative for chills, diaphoresis and fever.  HENT: Positive for congestion. Negative for sore throat.   Eyes: Negative for blurred vision and double vision.  Respiratory: Negative for cough, hemoptysis and shortness of breath.   Cardiovascular: Positive for chest pain and palpitations.  Gastrointestinal: Negative for blood in stool, nausea and vomiting.  Genitourinary: Negative for hematuria.  Musculoskeletal: Negative for falls and myalgias.  Neurological: Positive for dizziness. Negative for loss of consciousness.  Endo/Heme/Allergies: Bruises/bleeds easily (occasionally has easy bleeding; history of von Willebrand Disease ).  Psychiatric/Behavioral: Negative for substance abuse.    Physical Exam    Blood pressure 128/80, pulse 80, temperature 98 F (36.7 C), temperature source Oral, resp. rate 20, height 5' (1.524 m), weight 61.7 kg, SpO2 100 %.  General: 53 y.o. Caucasian female resting comfortably in no acute distress. Pleasant and cooperative. HEENT: Normal  Neck: Supple. No carotid bruits or JVD appreciated. Lungs: No increased work of breathing. Clear to auscultation bilaterally. No wheezes, rhonchi, or rales. Heart: Borderline tachycardic with regular rhythm. Distinct S1 and S2. No murmurs, gallops, or rubs.  Abdomen: Soft, non-distended, and non-tender to palpation. Bowel sounds present.  Extremities: No lower extremity edema.  Radial pulses 2+ and equal bilaterally. Skin: Warm and dry. Neuro: Alert and oriented x3. No focal deficits. Moves all extremities spontaneously. Psych: Normal affect.  Labs    Troponin [facility] of Care Test) Recent Labs    04/28/18 0030  TROPIPOC 0.00   Recent Labs    04/28/18 0239  TROPONINI <0.03   Lab Results  Component Value Date   WBC 6.3 04/28/2018   HGB 12.3 04/28/2018   HCT 38.3 04/28/2018   MCV 93.4 04/28/2018   PLT 272 04/28/2018    Recent Labs  Lab 04/27/18 2040 04/28/18 0239  NA 140  --   K 3.4*  --   CL 106  --   CO2 24  --   BUN 16  --   CREATININE 0.89 0.94  CALCIUM 9.6  --   GLUCOSE 118*  --  No results found for: CHOL, HDL, LDLCALC, TRIG No results found for: [facility]   Radiology Studies    Dg Chest 2 View  Result Date: 04/27/2018 CLINICAL DATA:  Chest pain. Intermittent palpitations. EXAM: CHEST - 2 VIEW COMPARISON:  None. FINDINGS: The cardiomediastinal contours are normal. The lungs are clear. Pulmonary vasculature is normal. No consolidation, pleural effusion, or pneumothorax. Minimum thoracolumbar scoliotic curvature. No acute osseous abnormalities are seen. IMPRESSION: No acute chest findings. Electronically Signed   By: Narda RutherfordMelanie  Sanford M.D.   On: 04/27/2018 20:45   Ct Angio Chest Pe W/cm &/or Wo Cm  Result Date: 04/28/2018 CLINICAL DATA:  53 y/o  F; intermittent palpitations for 5 days. EXAM: CT ANGIOGRAPHY CHEST WITH CONTRAST TECHNIQUE: Multidetector CT imaging of the chest was performed using the standard protocol during bolus administration of intravenous contrast. Multiplanar CT image reconstructions and MIPs were obtained to evaluate the vascular anatomy. CONTRAST:  100mL ISOVUE-370 IOPAMIDOL (ISOVUE-370) INJECTION 76% COMPARISON:  04/27/2018 chest radiograph. FINDINGS: Cardiovascular: Satisfactory opacification of the pulmonary arteries to the segmental level. No evidence of pulmonary embolism. Normal heart size. No pericardial effusion.  Mediastinum/Nodes: No enlarged mediastinal, hilar, or axillary lymph nodes. Thyroid gland, trachea, and esophagus demonstrate no significant findings. Lungs/Pleura: Lungs are clear. No pleural effusion or pneumothorax. Upper Abdomen: Small hiatal hernia. Multiple stable liver cysts. Musculoskeletal: No chest wall abnormality. No acute or significant osseous findings. Review of the MIP images confirms the above findings. IMPRESSION: 1. No pulmonary embolus identified.  Clear lungs. 2. Small hiatal hernia. Electronically Signed   By: Mitzi HansenLance  Furusawa-Stratton M.D.   On: 04/28/2018 02:23    EKG     EKG: EKG was personally reviewed and demonstrates: Sinus tachycardia, rate 112 bpm, with no acute ischemic changes.  Telemetry: Telemetry was personally reviewed and demonstrates: Normal sinus rhythm and sinus tachycardia with maximum heart rate in the 120's.   Cardiac Imaging    None.  Assessment & Plan    Symptomatic Tachycardia - Patient presented with palpitations with associated mild chest pressure and lightheadedness. - EKG and telemetry show sinus tachycardia with maximum heart rate in the 120's. - Initial I-stat troponin elevated at 0.13. Repeat I-stat negative. Repeat troponin I negative. Not consistent with ACS. - Chest CTA showed no evidence of PE.  - CBC unremarkable. - Potassium 3.4. - TSH normal. - Will check Magnesium. - Will check Echo. - Given patient is symptomatic, will add Lopressor 12.5mg  twice daily. - Consider outpatient monitor for further evaluation. Will discuss with MD.  Chest Pain - Patient reports very mild chest pain with the palpitation. Patient has never had any chest pain prior to this. No exertional symptoms. - Initial I-stat troponin elevated at 0.13 but repeats negative x3. Not consistent with ACS.  - Suspect secondary to tachycardia. No further ischemic work-up planned at this time.  Signed, Corrin Parkerallie E Winfred Redel, PA-C 04/28/2018, 1:16 PM  For questions or  updates, please contact   Please consult [email or web address] for contact info under Cardiology/STEMI.

## 2018-04-28 NOTE — ED Notes (Signed)
ED TO INPATIENT HANDOFF REPORT  Name/Age/Gender Sydney Bell 53 y.o. female  Code Status    Code Status Orders  (From admission, onward)         Start     Ordered   04/28/18 0240  Full code  Continuous     04/28/18 0240        Code Status History    This patient has a current code status but no historical code status.      Home/SNF/Other Home  Chief Complaint Rapid heat rate, Lightheadness  Level of Care/Admitting Diagnosis ED Disposition    ED Disposition Condition Comment   Admit  Hospital Area: [facility] [[id number]]  Level of Care: Medical Telemetry [104]  I expect the patient will be discharged within 24 hours: Yes  LOW acuity---Tx typically complete <24 hrs---ACUTE conditions typically can be evaluated <24 hours---LABS likely to return to acceptable levels <24 hours---IS near functional baseline---EXPECTED to return to current living arrangement---NOT newly hypoxic: Meets criteria for 5C-Observation unit  Diagnosis: Chest pain [841660]  Admitting Physician: Eduard Clos [phone]  Attending Physician: Eduard Clos [[id number]]  PT Class (Do Not Modify): Observation [104]  PT Acc Code (Do Not Modify): Observation [10022]       Medical History History reviewed. No pertinent past medical history.  Allergies No Known Allergies  IV Location/Drains/Wounds Patient Lines/Drains/Airways Status   Active Line/Drains/Airways    Name:   Placement date:   Placement time:   Site:   Days:   Peripheral IV 04/27/18 Left Wrist   04/27/18    2237    Wrist   1   Peripheral IV 04/28/18 Right Antecubital   04/28/18    0114    Antecubital   less than 1          Labs/Imaging Results for orders placed or performed during the hospital encounter of 04/27/18 (from the past 48 hour(s))  Basic metabolic panel     Status: Abnormal   Collection Time: 04/27/18  8:40 PM  Result Value Ref Range   Sodium 140 135 - 145 mmol/L   Potassium 3.4 (L) 3.5 -  5.1 mmol/L   Chloride 106 98 - 111 mmol/L   CO2 24 22 - 32 mmol/L   Glucose, Bld 118 (H) 70 - 99 mg/dL   BUN 16 6 - 20 mg/dL   Creatinine, Ser 6.01 0.44 - 1.00 mg/dL   Calcium 9.6 8.9 - 09.3 mg/dL   GFR calc non Af Amer >60 >60 mL/min   GFR calc Af Amer >60 >60 mL/min   Anion gap 10 5 - 15    Comment: Performed at [facility], [address], [place], [place] [address]  CBC     Status: Abnormal   Collection Time: 04/27/18  8:40 PM  Result Value Ref Range   WBC 8.1 4.0 - 10.5 K/uL   RBC 4.87 3.87 - 5.11 MIL/uL   Hemoglobin 14.4 12.0 - 15.0 g/dL   HCT 32.2 (H) 02.5 - 42.7 %   MCV 99.8 80.0 - 100.0 fL   MCH 29.6 26.0 - 34.0 pg   MCHC 29.6 (L) 30.0 - 36.0 g/dL   RDW 06.2 37.6 - 28.3 %   Platelets 262 150 - 400 K/uL   nRBC 0.0 0.0 - 0.2 %    Comment: Performed at [facility], [address] [place], [place] [address]  POCT i-Stat troponin I     Status: Abnormal  Collection Time: 04/27/18  8:49 PM  Result Value Ref Range   Troponin i, poc 0.13 (HH) 0.00 - 0.08 ng/mL   Comment NOTIFIED PHYSICIAN    Comment 3            Comment: Due to the release kinetics of cTnI, a negative result within the first hours of the onset of symptoms does not rule out myocardial infarction with certainty. If myocardial infarction is still suspected, repeat the test at appropriate intervals.   TSH     Status: None   Collection Time: 04/27/18 10:27 PM  Result Value Ref Range   TSH 1.305 0.350 - 4.500 uIU/mL    Comment: Performed by a 3rd Generation assay with a functional sensitivity of <=0.01 uIU/mL. Performed at [facility], [address] [place], [place] [address]   POCT i-Stat troponin I     Status: None   Collection Time: 04/28/18 12:30 AM  Result Value Ref Range   Troponin i, poc 0.00 0.00 - 0.08 ng/mL   Comment 3            Comment: Due to the release kinetics of cTnI, a negative result within the first hours of the onset of  symptoms does not rule out myocardial infarction with certainty. If myocardial infarction is still suspected, repeat the test at appropriate intervals.    Dg Chest 2 View  Result Date: 04/27/2018 CLINICAL DATA:  Chest pain. Intermittent palpitations. EXAM: CHEST - 2 VIEW COMPARISON:  None. FINDINGS: The cardiomediastinal contours are normal. The lungs are clear. Pulmonary vasculature is normal. No consolidation, pleural effusion, or pneumothorax. Minimum thoracolumbar scoliotic curvature. No acute osseous abnormalities are seen. IMPRESSION: No acute chest findings. Electronically Signed   By: Narda Rutherford M.D.   On: 04/27/2018 20:45   Ct Angio Chest Pe W/cm &/or Wo Cm  Result Date: 04/28/2018 CLINICAL DATA:  53 y/o  F; intermittent palpitations for 5 days. EXAM: CT ANGIOGRAPHY CHEST WITH CONTRAST TECHNIQUE: Multidetector CT imaging of the chest was performed using the standard protocol during bolus administration of intravenous contrast. Multiplanar CT image reconstructions and MIPs were obtained to evaluate the vascular anatomy. CONTRAST:  ISOVUE-370 IOPAMIDOL (ISOVUE-370) INJECTION 76% COMPARISON:  04/27/2018 chest radiograph. FINDINGS: Cardiovascular: Satisfactory opacification of the pulmonary arteries to the segmental level. No evidence of pulmonary embolism. Normal heart size. No pericardial effusion. Mediastinum/Nodes: No enlarged mediastinal, hilar, or axillary lymph nodes. Thyroid gland, trachea, and esophagus demonstrate no significant findings. Lungs/Pleura: Lungs are clear. No pleural effusion or pneumothorax. Upper Abdomen: Small hiatal hernia. Multiple stable liver cysts. Musculoskeletal: No chest wall abnormality. No acute or significant osseous findings. Review of the MIP images confirms the above findings. IMPRESSION: 1. No pulmonary embolus identified.  Clear lungs. 2. Small hiatal hernia. Electronically Signed   By: Mitzi Hansen M.D.   On: 04/28/2018 02:23     Pending Labs Unresulted Labs (From admission, onward)    Start     Ordered   05/05/18 0500  Creatinine, serum  (enoxaparin (LOVENOX)    CrCl >/= 30 ml/min)  Weekly,   R    Comments:  while on enoxaparin therapy    04/28/18 0240   04/28/18 0241  CBC  (enoxaparin (LOVENOX)    CrCl >/= 30 ml/min)  Once,   R    Comments:  Baseline for enoxaparin therapy IF NOT ALREADY DRAWN.  Notify MD if PLT < 100 K.    04/28/18 0240   04/28/18 0241  Creatinine, serum  (  enoxaparin (LOVENOX)    CrCl >/= 30 ml/min)  Once,   R    Comments:  Baseline for enoxaparin therapy IF NOT ALREADY DRAWN.    04/28/18 0240   04/28/18 0241  Troponin I - Now Then Q6H  Now then every 6 hours,   R     04/28/18 0240   04/28/18 0239  HIV antibody (Routine Testing)  Once,   R     04/28/18 0240          Vitals/Pain Today's Vitals   04/27/18 2300 04/28/18 0003 04/28/18 0030 04/28/18 0100  BP: (!) 141/87  (!) 126/93 132/87  Pulse: (!) 117 97 86 91  Resp: (!) 25 (!) 21 13 (!) 22  Temp:      TempSrc:      SpO2: 98% 99% 98% 98%  Weight:      Height:      PainSc:  0-No pain 0-No pain 0-No pain    Isolation Precautions No active isolations  Medications Medications  acetaminophen (TYLENOL) tablet 650 mg (has no administration in time range)  ondansetron (ZOFRAN) injection 4 mg (has no administration in time range)  enoxaparin (LOVENOX) injection 40 mg (has no administration in time range)  aspirin EC tablet 325 mg (has no administration in time range)  sodium chloride flush (NS) 0.9 % injection 3 mL (3 mLs Intravenous Given 04/27/18 2238)  aspirin chewable tablet 324 mg (324 mg Oral Given 04/27/18 2224)  iopamidol (ISOVUE-370) 76 % injection 100 mL (100 mLs Intravenous Contrast Given 04/28/18 0138)    Mobility walks

## 2018-05-02 ENCOUNTER — Other Ambulatory Visit (HOSPITAL_COMMUNITY): Payer: 59

## 2018-05-03 ENCOUNTER — Other Ambulatory Visit (HOSPITAL_COMMUNITY): Payer: 59

## 2018-05-11 ENCOUNTER — Other Ambulatory Visit: Payer: Self-pay | Admitting: Student

## 2018-05-11 ENCOUNTER — Ambulatory Visit (INDEPENDENT_AMBULATORY_CARE_PROVIDER_SITE_OTHER): Payer: 59

## 2018-05-11 ENCOUNTER — Ambulatory Visit (HOSPITAL_COMMUNITY): Payer: 59 | Attending: Cardiology

## 2018-05-11 ENCOUNTER — Encounter: Payer: Self-pay | Admitting: *Deleted

## 2018-05-11 DIAGNOSIS — R002 Palpitations: Secondary | ICD-10-CM | POA: Insufficient documentation

## 2018-05-11 NOTE — Progress Notes (Signed)
Patient ID: Sydney Bell, female   DOB: 11-29-1965, 53 y.o.   MRN: [id number] [employer] long term holter monitor used in place of ordered Zio patch holter monitor.  Patient scheduled for subsequent Echo.  Non removable/ replaceable Zio patch would have been in the way for quality echo. Discussed with echo tech.  Pre[employer]ice monitor should not get in way, however it is replaceable if removal necessary.

## 2018-05-20 ENCOUNTER — Ambulatory Visit: Payer: 59 | Admitting: Physician Assistant

## 2018-05-31 ENCOUNTER — Telehealth: Payer: Self-pay

## 2018-05-31 NOTE — Telephone Encounter (Signed)
Pt updated with monitor results. Pt states she's leaving for [place] next week and was wanting to get the approval from Dr. Rennis Golden if she would be ok to ride all of the rides. She report all of her test so far has been normal. Will route to MD.

## 2018-05-31 NOTE — Telephone Encounter (Signed)
-----   Message from Chrystie Nose, MD sent at 05/27/2018 12:37 PM EST ----- See monitor note.  Dr Rexene Edison

## 2018-06-01 NOTE — Telephone Encounter (Signed)
I don't see anything concerning that should keep her from traveling.  Dr. Rexene Edison

## 2018-06-01 NOTE — Telephone Encounter (Signed)
Left message to call back  

## 2018-06-01 NOTE — Telephone Encounter (Signed)
Pt updated with Dr. Blanchie Dessert recommendations.

## 2018-06-20 ENCOUNTER — Telehealth: Payer: Self-pay | Admitting: Physician Assistant

## 2018-06-20 NOTE — Telephone Encounter (Signed)
   Primary Cardiologist:  Chrystie Nose, MD   Patient contacted.  History reviewed.  No symptoms to suggest any unstable cardiac conditions.  Based on discussion, with current pandemic situation, we will be postponing this appointment for Sydney Bell.  If symptoms change, she has been instructed to contact our office.   Routing to C19 CANCEL pool for tracking (P CV DIV CV19 CANCEL) and assigning priority (2 = 6-12 wks).  Azalee Course, [place]  06/20/2018 3:57 PM     Note, patient recently returned from [place] 2 weeks ago, denies any fever, chill or cough.     Marland Kitchen

## 2018-06-22 ENCOUNTER — Ambulatory Visit: Payer: 59 | Admitting: Physician Assistant

## 2018-07-04 ENCOUNTER — Telehealth: Payer: Self-pay

## 2018-07-04 NOTE — Telephone Encounter (Signed)
lmtcb

## 2018-07-04 NOTE — Telephone Encounter (Signed)
Follow up:  Patent returning your call. Please call patientback.

## 2018-07-05 NOTE — Telephone Encounter (Signed)
Patient opted to wait for in-office appointment. She reports that Gentry reviewed her test results when he called to cancel her previous appointment. She also states that she feels fine as of right now.

## 2019-06-01 IMAGING — CT CT ANGIO CHEST
2 of 6 series · 18 of 46 positions shown · IV contrast (ISOVUE 370)
Comparison: 04/27/2018 chest radiograph.

CLINICAL DATA: 52 y/o  F; intermittent palpitations for 5 days.

EXAM:
CT ANGIOGRAPHY CHEST WITH CONTRAST
TECHNIQUE: Multidetector CT imaging of the chest was performed using the
standard protocol during bolus administration of intravenous
contrast. Multiplanar CT image reconstructions and MIPs were
obtained to evaluate the vascular anatomy.
CONTRAST:  100mL TQY2F4-IWD IOPAMIDOL (TQY2F4-IWD) INJECTION 76%

[Series 5: thins · axial · 0.63mm/px · z∈[+1548,+1759]mm · 15 of 233 slices shown]
[im 11/233  lung]
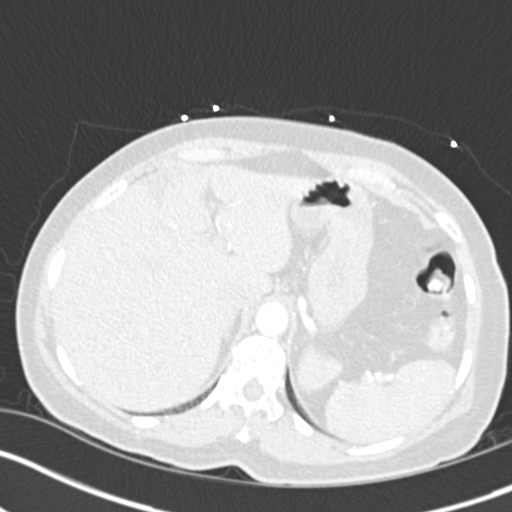
[im 31/233  soft-tissue]
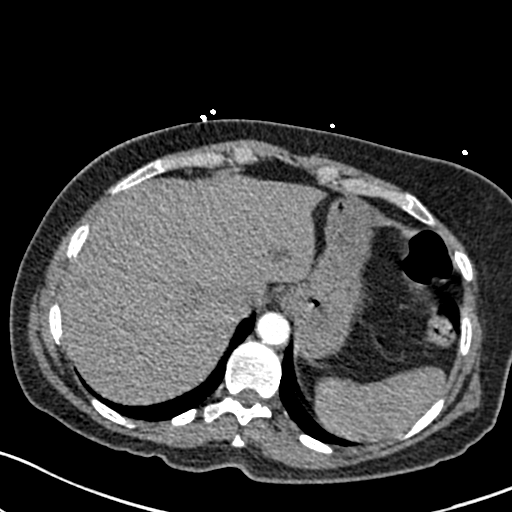
[im 41/233  lung]
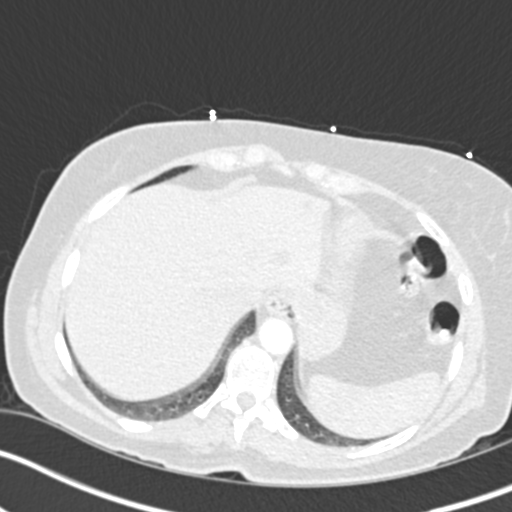
[im 61/233  soft-tissue]
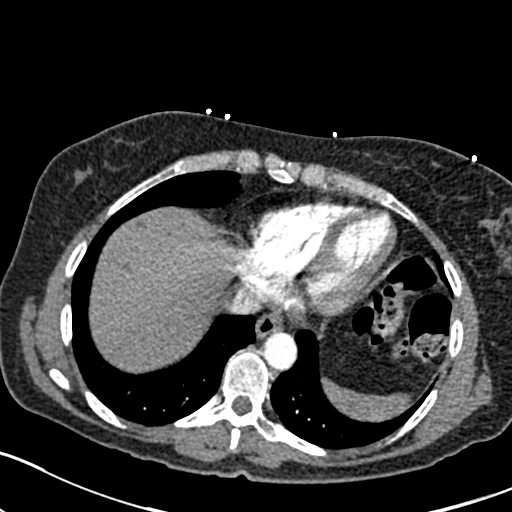
[im 71/233  lung]
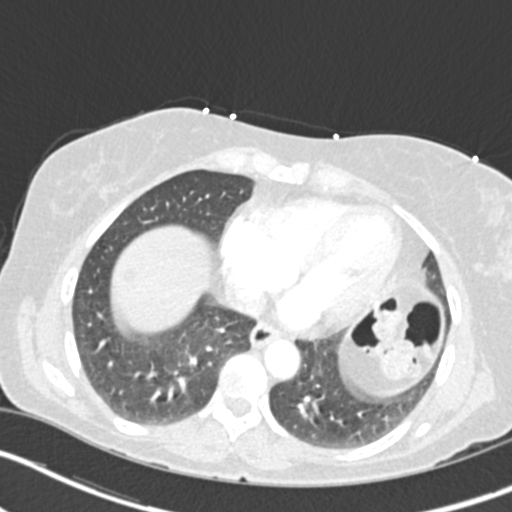
[im 91/233  soft-tissue]
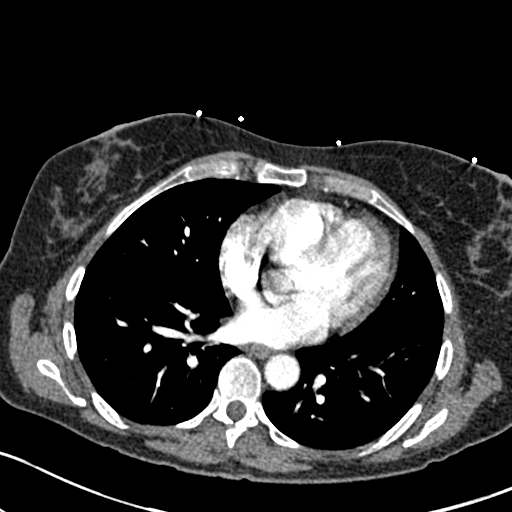
[im 101/233  lung]
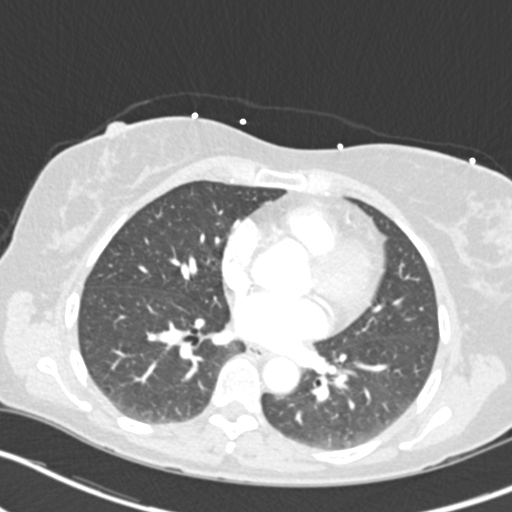
[im 122/233  soft-tissue]
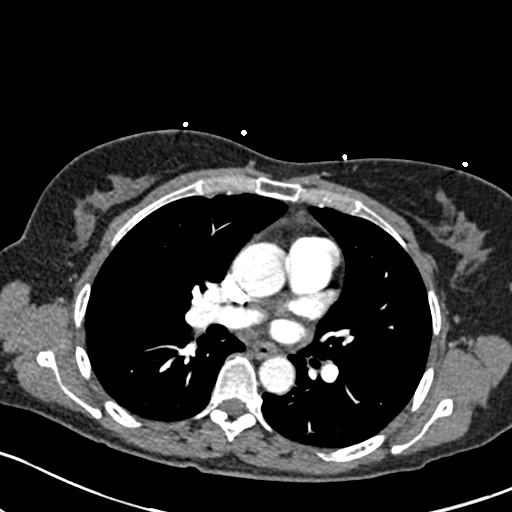
[im 132/233  lung]
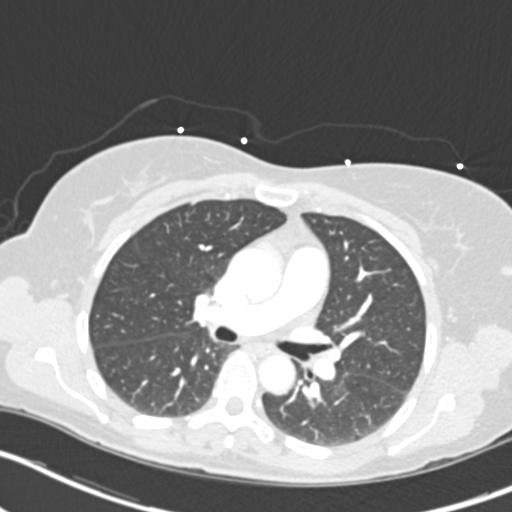
[im 142/233  soft-tissue]
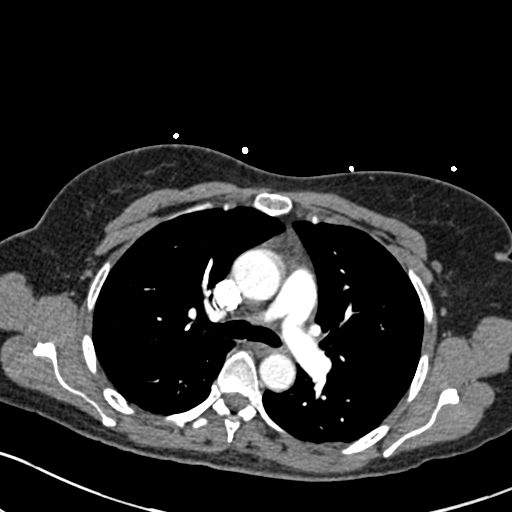
[im 162/233  lung]
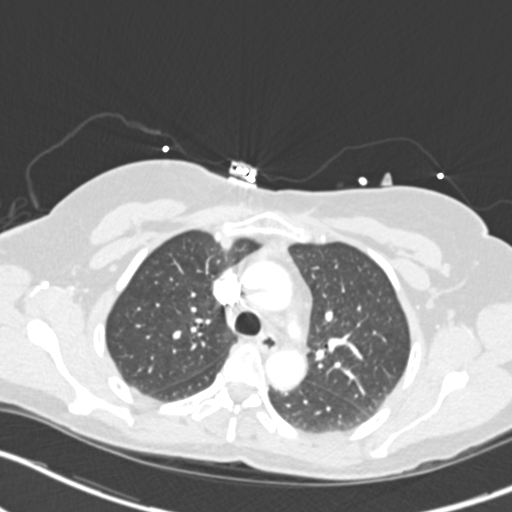
[im 172/233  soft-tissue]
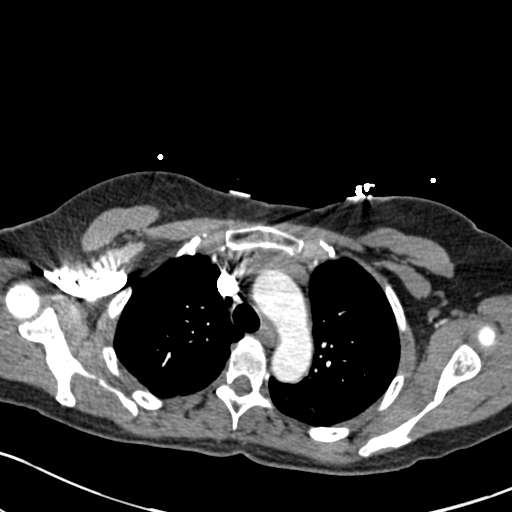
[im 192/233  lung]
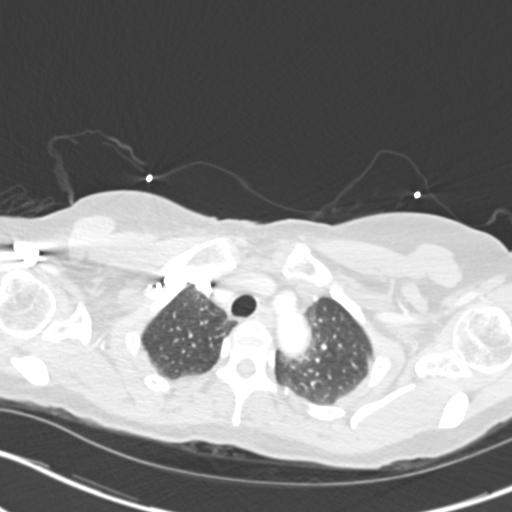
[im 202/233  soft-tissue]
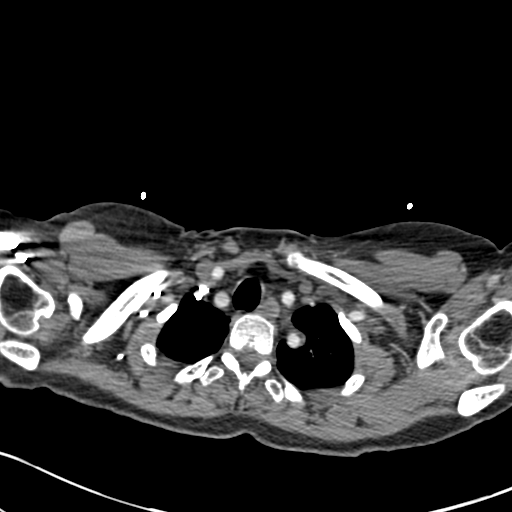
[im 222/233  lung]
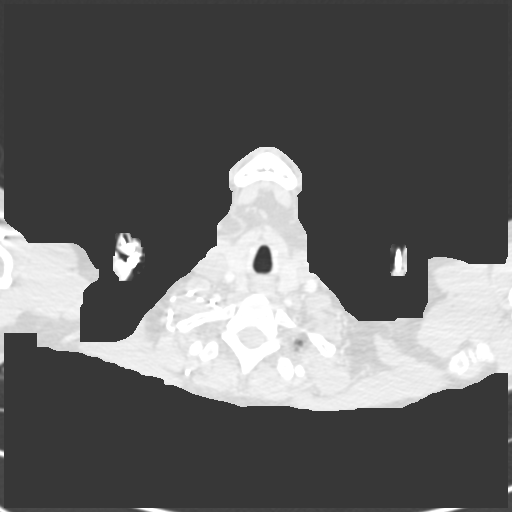

[Series 7: coronal mpr · coronal · 0.46mm/px · 3 of 108 slices shown]
[im 27/108  soft-tissue]
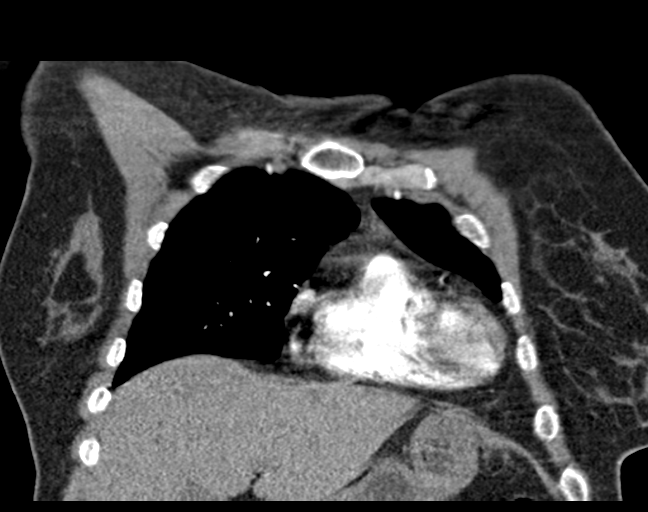
[im 54/108  soft-tissue]
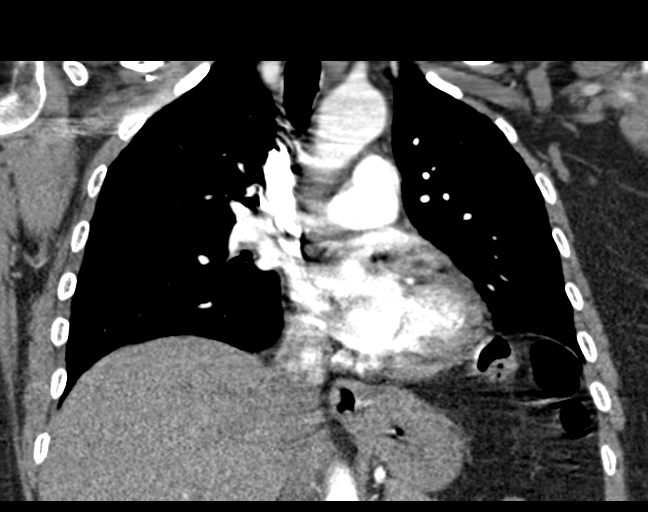
[im 81/108  soft-tissue]
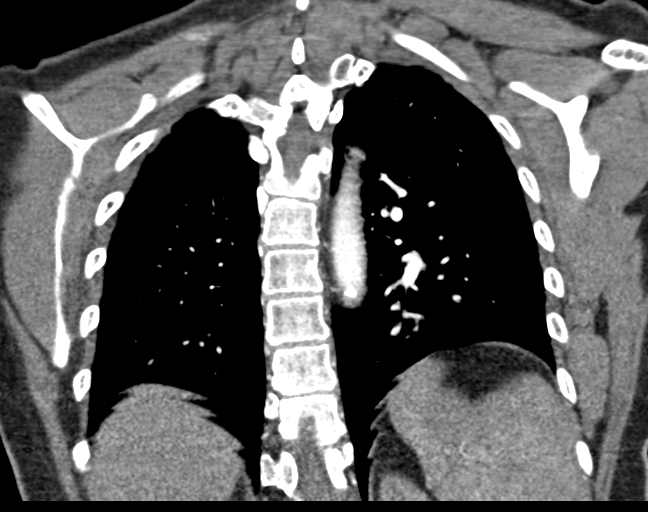

[18 of 46 positions shown; findings below may reference images not displayed]

FINDINGS: Cardiovascular: Satisfactory opacification of the pulmonary arteries
to the segmental level. No evidence of pulmonary embolism. Normal
heart size. No pericardial effusion.

Mediastinum/Nodes: No enlarged mediastinal, hilar, or axillary lymph
nodes. Thyroid gland, trachea, and esophagus demonstrate no
significant findings.

Lungs/Pleura: Lungs are clear. No pleural effusion or pneumothorax.

Upper Abdomen: Small hiatal hernia. Multiple stable liver cysts.

Musculoskeletal: No chest wall abnormality. No acute or significant
osseous findings.

Review of the MIP images confirms the above findings.
IMPRESSION: 1. No pulmonary embolus identified.  Clear lungs.
2. Small hiatal hernia.

## 2020-08-17 LAB — COLOGUARD: COLOGUARD: NEGATIVE

## 2020-12-22 ENCOUNTER — Encounter (HOSPITAL_BASED_OUTPATIENT_CLINIC_OR_DEPARTMENT_OTHER): Payer: Self-pay | Admitting: *Deleted

## 2020-12-22 ENCOUNTER — Encounter (HOSPITAL_COMMUNITY)
Admission: EM | Disposition: A | Discharge status: Home / Self Care | Payer: Self-pay | Source: Home / Self Care | Attending: Emergency Medicine

## 2020-12-22 ENCOUNTER — Emergency Department (HOSPITAL_BASED_OUTPATIENT_CLINIC_OR_DEPARTMENT_OTHER): Payer: 59

## 2020-12-22 ENCOUNTER — Emergency Department (HOSPITAL_COMMUNITY): Payer: 59 | Admitting: Anesthesiology

## 2020-12-22 ENCOUNTER — Ambulatory Visit (HOSPITAL_BASED_OUTPATIENT_CLINIC_OR_DEPARTMENT_OTHER)
Admission: EM | Admit: 2020-12-22 | Discharge: 2020-12-22 | Disposition: A | Discharge status: Home / Self Care | Payer: 59 | Attending: Emergency Medicine | Admitting: Emergency Medicine

## 2020-12-22 ENCOUNTER — Other Ambulatory Visit: Payer: Self-pay

## 2020-12-22 DIAGNOSIS — K37 Unspecified appendicitis: Secondary | ICD-10-CM

## 2020-12-22 DIAGNOSIS — N281 Cyst of kidney, acquired: Secondary | ICD-10-CM | POA: Insufficient documentation

## 2020-12-22 DIAGNOSIS — K7689 Other specified diseases of liver: Secondary | ICD-10-CM | POA: Diagnosis not present

## 2020-12-22 DIAGNOSIS — D680 Von Willebrand's disease: Secondary | ICD-10-CM | POA: Diagnosis not present

## 2020-12-22 DIAGNOSIS — K449 Diaphragmatic hernia without obstruction or gangrene: Secondary | ICD-10-CM | POA: Diagnosis not present

## 2020-12-22 DIAGNOSIS — Z20822 Contact with and (suspected) exposure to covid-19: Secondary | ICD-10-CM | POA: Diagnosis not present

## 2020-12-22 DIAGNOSIS — K3580 Unspecified acute appendicitis: Secondary | ICD-10-CM

## 2020-12-22 DIAGNOSIS — I10 Essential (primary) hypertension: Secondary | ICD-10-CM | POA: Diagnosis not present

## 2020-12-22 HISTORY — DX: Von Willebrand disease, unspecified: D68.00

## 2020-12-22 HISTORY — PX: LAPAROSCOPIC APPENDECTOMY: SHX408

## 2020-12-22 HISTORY — DX: Von Willebrand's disease: D68.0

## 2020-12-22 LAB — CBC
HCT: 40.6 % (ref 36.0–46.0)
Hemoglobin: 13.8 g/dL (ref 12.0–15.0)
MCH: 29.6 pg (ref 26.0–34.0)
MCHC: 34 g/dL (ref 30.0–36.0)
MCV: 87.1 fL (ref 80.0–100.0)
Platelets: 264 10*3/uL (ref 150–400)
RBC: 4.66 MIL/uL (ref 3.87–5.11)
RDW: 13 % (ref 11.5–15.5)
WBC: 5.3 10*3/uL (ref 4.0–10.5)
nRBC: 0 % (ref 0.0–0.2)

## 2020-12-22 LAB — COMPREHENSIVE METABOLIC PANEL
ALT: 20 U/L (ref 0–44)
AST: 21 U/L (ref 15–41)
Albumin: 4.6 g/dL (ref 3.5–5.0)
Alkaline Phosphatase: 69 U/L (ref 38–126)
Anion gap: 12 (ref 5–15)
BUN: 14 mg/dL (ref 6–20)
CO2: 23 mmol/L (ref 22–32)
Calcium: 10.3 mg/dL (ref 8.9–10.3)
Chloride: 103 mmol/L (ref 98–111)
Creatinine, Ser: 0.85 mg/dL (ref 0.44–1.00)
GFR, Estimated: >60 mL/min (ref >60)
Glucose, Bld: 126 mg/dL — ABNORMAL HIGH (ref 70–99)
Potassium: 4 mmol/L (ref 3.5–5.1)
Sodium: 138 mmol/L (ref 135–145)
Total Bilirubin: 0.5 mg/dL (ref 0.3–1.2)
Total Protein: 7.3 g/dL (ref 6.5–8.1)

## 2020-12-22 LAB — URINALYSIS, ROUTINE W REFLEX MICROSCOPIC
Bilirubin Urine: NEGATIVE
Glucose, UA: NEGATIVE mg/dL
Ketones, ur: 15 mg/dL — AB
Nitrite: NEGATIVE
Protein, ur: NEGATIVE mg/dL
Specific Gravity, Urine: 1.011 (ref 1.005–1.030)
pH: 6 (ref 5.0–8.0)

## 2020-12-22 LAB — PROTIME-INR
INR: 1 (ref 0.8–1.2)
Prothrombin Time: 13.6 seconds (ref 11.4–15.2)

## 2020-12-22 LAB — TYPE AND SCREEN
ABO/RH(D): O POS
Antibody Screen: NEGATIVE

## 2020-12-22 LAB — RESP PANEL BY RT-PCR (FLU A&B, COVID) ARPGX2
Influenza A by PCR: NEGATIVE
Influenza B by PCR: NEGATIVE
SARS Coronavirus 2 by RT PCR: NEGATIVE

## 2020-12-22 LAB — LIPASE, BLOOD: Lipase: 14 U/L (ref 11–51)

## 2020-12-22 LAB — APTT: aPTT: 28 seconds (ref 24–36)

## 2020-12-22 SURGERY — APPENDECTOMY, LAPAROSCOPIC
Anesthesia: General

## 2020-12-22 MED ORDER — LIDOCAINE HCL (CARDIAC) PF 100 MG/5ML IV SOSY
PREFILLED_SYRINGE | INTRAVENOUS | Status: DC | PRN
  Administered 2020-12-22: 50 mg via INTRAVENOUS

## 2020-12-22 MED ORDER — ONDANSETRON HCL 4 MG/2ML IJ SOLN
INTRAMUSCULAR | Status: DC | PRN
Start: 2020-12-22 — End: 2020-12-22
  Administered 2020-12-22: 4 mg via INTRAVENOUS

## 2020-12-22 MED ORDER — MIDAZOLAM HCL 2 MG/2ML IJ SOLN
INTRAMUSCULAR | Status: AC
  Filled 2020-12-22: qty 2

## 2020-12-22 MED ORDER — FENTANYL CITRATE (PF) 100 MCG/2ML IJ SOLN: 25.0000 ug | INTRAMUSCULAR | Status: DC | PRN

## 2020-12-22 MED ORDER — FENTANYL CITRATE (PF) 250 MCG/5ML IJ SOLN
INTRAMUSCULAR | Status: AC
  Filled 2020-12-22: qty 5

## 2020-12-22 MED ORDER — SUCCINYLCHOLINE 20MG/ML (10ML) SYRINGE FOR MEDFUSION PUMP - OPTIME
INTRAMUSCULAR | Status: DC | PRN
  Administered 2020-12-22: 120 mg via INTRAVENOUS

## 2020-12-22 MED ORDER — LIDOCAINE HCL (PF) 1 % IJ SOLN
INTRAMUSCULAR | Status: AC
  Filled 2020-12-22: qty 30

## 2020-12-22 MED ORDER — 0.9 % SODIUM CHLORIDE (POUR BTL) OPTIME
TOPICAL | Status: DC | PRN
  Administered 2020-12-22: 1000 mL

## 2020-12-22 MED ORDER — SODIUM CHLORIDE 0.9 % IV BOLUS
1000.0000 mL | Freq: Once | INTRAVENOUS | Status: AC
  Administered 2020-12-22: 1000 mL via INTRAVENOUS

## 2020-12-22 MED ORDER — OXYCODONE HCL 5 MG PO TABS: 5.0000 mg | ORAL_TABLET | Freq: Once | ORAL | 0 refills | Status: AC | PRN

## 2020-12-22 MED ORDER — BUPIVACAINE-EPINEPHRINE (PF) 0.25% -1:200000 IJ SOLN
INTRAMUSCULAR | Status: AC
  Filled 2020-12-22: qty 30

## 2020-12-22 MED ORDER — DEXAMETHASONE SODIUM PHOSPHATE 10 MG/ML IJ SOLN
INTRAMUSCULAR | Status: DC | PRN
Start: 2020-12-22 — End: 2020-12-22
  Administered 2020-12-22: 10 mg via INTRAVENOUS

## 2020-12-22 MED ORDER — LIDOCAINE HCL 1 % IJ SOLN
INTRAMUSCULAR | Status: DC | PRN
  Administered 2020-12-22: 10 mL via INTRADERMAL

## 2020-12-22 MED ORDER — PROPOFOL 10 MG/ML IV BOLUS
INTRAVENOUS | Status: AC
  Filled 2020-12-22: qty 20

## 2020-12-22 MED ORDER — ROCURONIUM BROMIDE 10 MG/ML (PF) SYRINGE
PREFILLED_SYRINGE | INTRAVENOUS | Status: AC
  Filled 2020-12-22: qty 10

## 2020-12-22 MED ORDER — OXYCODONE HCL 5 MG PO TABS: 5.0000 mg | ORAL_TABLET | Freq: Once | ORAL | Status: DC | PRN

## 2020-12-22 MED ORDER — PHENYLEPHRINE HCL (PRESSORS) 10 MG/ML IV SOLN
INTRAVENOUS | Status: DC | PRN
  Administered 2020-12-22 (×2): 80 ug via INTRAVENOUS

## 2020-12-22 MED ORDER — ONDANSETRON HCL 4 MG/2ML IJ SOLN
4.0000 mg | Freq: Once | INTRAMUSCULAR | Status: AC
  Administered 2020-12-22: 4 mg via INTRAVENOUS
  Filled 2020-12-22: qty 2

## 2020-12-22 MED ORDER — PROPOFOL 10 MG/ML IV BOLUS
INTRAVENOUS | Status: DC | PRN
  Administered 2020-12-22: 120 mg via INTRAVENOUS

## 2020-12-22 MED ORDER — METRONIDAZOLE 500 MG/100ML IV SOLN
500.0000 mg | Freq: Once | INTRAVENOUS | Status: AC
  Administered 2020-12-22: 500 mg via INTRAVENOUS
  Filled 2020-12-22 (×2): qty 100

## 2020-12-22 MED ORDER — ROCURONIUM 10MG/ML (10ML) SYRINGE FOR MEDFUSION PUMP - OPTIME
INTRAVENOUS | Status: DC | PRN
  Administered 2020-12-22: 50 mg via INTRAVENOUS

## 2020-12-22 MED ORDER — DEXAMETHASONE SODIUM PHOSPHATE 10 MG/ML IJ SOLN
INTRAMUSCULAR | Status: AC
  Filled 2020-12-22: qty 1

## 2020-12-22 MED ORDER — SUCCINYLCHOLINE CHLORIDE 200 MG/10ML IV SOSY
PREFILLED_SYRINGE | INTRAVENOUS | Status: AC
  Filled 2020-12-22: qty 10

## 2020-12-22 MED ORDER — OXYCODONE HCL 5 MG/5ML PO SOLN: 5.0000 mg | Freq: Once | ORAL | Status: DC | PRN

## 2020-12-22 MED ORDER — SUGAMMADEX SODIUM 200 MG/2ML IV SOLN
INTRAVENOUS | Status: DC | PRN
  Administered 2020-12-22: 250 mg via INTRAVENOUS

## 2020-12-22 MED ORDER — SODIUM CHLORIDE 0.9 % IV SOLN: INTRAVENOUS | Status: DC | PRN

## 2020-12-22 MED ORDER — ONDANSETRON HCL 4 MG/2ML IJ SOLN
INTRAMUSCULAR | Status: AC
  Filled 2020-12-22: qty 2

## 2020-12-22 MED ORDER — SODIUM CHLORIDE 0.9 % IR SOLN
Status: DC | PRN
  Administered 2020-12-22: 1000 mL

## 2020-12-22 MED ORDER — PROMETHAZINE HCL 25 MG/ML IJ SOLN: 6.2500 mg | INTRAMUSCULAR | Status: DC | PRN

## 2020-12-22 MED ORDER — HYDROMORPHONE HCL 1 MG/ML IJ SOLN
1.0000 mg | Freq: Once | INTRAMUSCULAR | Status: AC
  Administered 2020-12-22: 1 mg via INTRAVENOUS
  Filled 2020-12-22: qty 1

## 2020-12-22 MED ORDER — LACTATED RINGERS IV SOLN: INTRAVENOUS | Status: DC | PRN

## 2020-12-22 MED ORDER — ONDANSETRON HCL 4 MG/2ML IJ SOLN: 4.0000 mg | Freq: Once | INTRAMUSCULAR | Status: DC | PRN

## 2020-12-22 MED ORDER — SUGAMMADEX SODIUM 500 MG/5ML IV SOLN
INTRAVENOUS | Status: AC
  Filled 2020-12-22: qty 5

## 2020-12-22 MED ORDER — LIDOCAINE HCL (PF) 2 % IJ SOLN
INTRAMUSCULAR | Status: AC
  Filled 2020-12-22: qty 5

## 2020-12-22 MED ORDER — FENTANYL CITRATE (PF) 250 MCG/5ML IJ SOLN
INTRAMUSCULAR | Status: DC | PRN
  Administered 2020-12-22: 50 ug via INTRAVENOUS

## 2020-12-22 MED ORDER — IOHEXOL 350 MG/ML SOLN
80.0000 mL | Freq: Once | INTRAVENOUS | Status: AC | PRN
  Administered 2020-12-22: 80 mL via INTRAVENOUS

## 2020-12-22 MED ORDER — KETOROLAC TROMETHAMINE 30 MG/ML IJ SOLN
30.0000 mg | Freq: Once | INTRAMUSCULAR | Status: AC
  Administered 2020-12-22: 30 mg via INTRAVENOUS
  Filled 2020-12-22: qty 1

## 2020-12-22 MED ORDER — MIDAZOLAM HCL 2 MG/2ML IJ SOLN
INTRAMUSCULAR | Status: DC | PRN
  Administered 2020-12-22: 2 mg via INTRAVENOUS

## 2020-12-22 MED ORDER — SODIUM CHLORIDE 0.9 % IV SOLN
2.0000 g | Freq: Once | INTRAVENOUS | Status: AC
  Administered 2020-12-22: 2 g via INTRAVENOUS
  Filled 2020-12-22 (×2): qty 20

## 2020-12-22 SURGICAL SUPPLY — 41 items
ADH SKN CLS APL DERMABOND .7 (GAUZE/BANDAGES/DRESSINGS) ×1
APL PRP STRL LF DISP 70% ISPRP (MISCELLANEOUS) ×1
BAG SPEC RTRVL 10 TROC 200 (ENDOMECHANICALS) ×1
CANISTER SUCT 3000ML PPV (MISCELLANEOUS) ×2 IMPLANT
CHLORAPREP W/TINT 26 (MISCELLANEOUS) ×2 IMPLANT
COVER SURGICAL LIGHT HANDLE (MISCELLANEOUS) ×2 IMPLANT
CUTTER FLEX LINEAR 45M (STAPLE) ×2 IMPLANT
DERMABOND ADVANCED (GAUZE/BANDAGES/DRESSINGS) ×1
DERMABOND ADVANCED .7 DNX12 (GAUZE/BANDAGES/DRESSINGS) ×1 IMPLANT
ELECT REM PT RETURN 9FT ADLT (ELECTROSURGICAL) ×2
ELECTRODE REM PT RTRN 9FT ADLT (ELECTROSURGICAL) ×1 IMPLANT
GLOVE SURG ENC MOIS LTX SZ6 (GLOVE) ×2 IMPLANT
GLOVE SURG UNDER LTX SZ6.5 (GLOVE) ×2 IMPLANT
GOWN STRL REUS W/ TWL LRG LVL3 (GOWN DISPOSABLE) ×2 IMPLANT
GOWN STRL REUS W/TWL 2XL LVL3 (GOWN DISPOSABLE) ×2 IMPLANT
GOWN STRL REUS W/TWL LRG LVL3 (GOWN DISPOSABLE) ×4
KIT BASIN OR (CUSTOM PROCEDURE TRAY) ×2 IMPLANT
KIT TURNOVER KIT B (KITS) ×2 IMPLANT
L-HOOK LAP DISP 36CM (ELECTROSURGICAL) ×2
LHOOK LAP DISP 36CM (ELECTROSURGICAL) ×1 IMPLANT
NS IRRIG 1000ML POUR BTL (IV SOLUTION) ×2 IMPLANT
PAD ARMBOARD 7.5X6 YLW CONV (MISCELLANEOUS) ×4 IMPLANT
PENCIL BUTTON HOLSTER BLD 10FT (ELECTRODE) ×2 IMPLANT
POUCH RETRIEVAL ECOSAC 10 (ENDOMECHANICALS) ×1 IMPLANT
POUCH RETRIEVAL ECOSAC 10MM (ENDOMECHANICALS) ×2
RELOAD STAPLE 45 3.5 BLU ETS (ENDOMECHANICALS) ×1 IMPLANT
RELOAD STAPLE TA45 3.5 REG BLU (ENDOMECHANICALS) ×2 IMPLANT
SET IRRIG TUBING LAPAROSCOPIC (IRRIGATION / IRRIGATOR) ×2 IMPLANT
SET TUBE SMOKE EVAC HIGH FLOW (TUBING) ×2 IMPLANT
SHEARS HARMONIC HDI 36CM (ELECTROSURGICAL) ×2 IMPLANT
SLEEVE ENDOPATH XCEL 5M (ENDOMECHANICALS) ×2 IMPLANT
SPECIMEN JAR SMALL (MISCELLANEOUS) ×2 IMPLANT
SUT MNCRL AB 4-0 PS2 18 (SUTURE) ×2 IMPLANT
TOWEL GREEN STERILE (TOWEL DISPOSABLE) ×2 IMPLANT
TOWEL GREEN STERILE FF (TOWEL DISPOSABLE) ×2 IMPLANT
TRAY FOLEY W/BAG SLVR 16FR (SET/KITS/TRAYS/PACK) ×2
TRAY FOLEY W/BAG SLVR 16FR ST (SET/KITS/TRAYS/PACK) ×1 IMPLANT
TRAY LAPAROSCOPIC MC (CUSTOM PROCEDURE TRAY) ×2 IMPLANT
TROCAR XCEL BLUNT TIP 100MML (ENDOMECHANICALS) ×2 IMPLANT
TROCAR XCEL NON-BLD 5MMX100MML (ENDOMECHANICALS) ×2 IMPLANT
WARMER LAPAROSCOPE (MISCELLANEOUS) ×2 IMPLANT

## 2020-12-22 NOTE — Consult Note (Signed)
[facility] Telephone:([phone]) [phone]   [phone]) [phone]  INITIAL CONSULT NOTE  Patient Care Team: Pcp, No as PCP - General Hilty, Lisette Abu, MD as PCP - Cardiology (Cardiology)  Hematological/Oncological History # History of von Willebrand Disease # Pending Urgent Surgery for Appendicitis   CHIEF COMPLAINTS/PURPOSE OF CONSULTATION:  "History of von Willebrand Disease "  HISTORY OF PRESENTING ILLNESS:  Sydney Bell 55 y.o. female with medical history significant for von Willebrand disease (reported) who presents with appendicitis and the need for urgent surgery.   On review of the previous records [name] has a noted from 2009 from which states: " She is known to have von Willebrand's disease.  In the past, she was followed by Dr. Glenford Peers while he was here in  [facility] for vaginal deliveries.  He did not recommend additional  treatment for this other than in the event of hemorrhage, which did not  occur". She was noted to have had  "laparoscopy along with hysteroscopy, D&C, and resection of her  endometrial polyp in February.  She has failed to clinically respond and  is now admitted for more definitive surgery.  Additional operative  procedures include a laparoscopy in 1998 with essentially similar  findings.  She has had three vaginal deliveries.  She has had a  tonsillectomy many years ago.  She has had previous laparoscopic  evaluation of her knee".   On exam today [name] she has never had serious bleeding complications after procedure other than the tonsillectomy she had performed at age 47-18.  She notes that she does occasionally have some nosebleeds and rare bruising.  She notes no other overt sources of bleeding.  She is tolerating her prior procedures well noting that she has never required prophylaxis or supportive medications for her von Willebrand's disease.  She notes that she was administered DDAVP around the time of diagnosis  and that she developed severe hypotension.  She notes that it "almost killed me".  She reports that the majority of her diagnosis was performed at [facility] and subsequently she was cared for by Dr. Mariel Sleet here locally.  Today I discussed the risks and benefits of proceeding with surgery without knowing the exact type of von Willebrand disease.  We will have supporting factor on standby and be prepared for the eventuality of bleeding.  Given her multiple prior procedures without severe bleeding I believe this is reassuring that she should be able to tolerate this procedure well.  MEDICAL HISTORY:  Past Medical History:  Diagnosis Date   Von Willebrand disease (HCC)     SURGICAL HISTORY: Past Surgical History:  Procedure Laterality Date   ABDOMINAL HYSTERECTOMY     KNEE SURGERY      SOCIAL HISTORY: Social History   Socioeconomic History   Marital status: Married    Spouse name: Not on file   Number of children: Not on file   Years of education: Not on file   Highest education level: Not on file  Occupational History   Not on file  Tobacco Use   Smoking status: Never   Smokeless tobacco: Never  Vaping Use   Vaping Use: Never used  Substance and Sexual Activity   Alcohol use: Never   Drug use: Never   Sexual activity: Not on file  Other Topics Concern   Not on file  Social History Narrative   Not on file   Social Determinants of Health   Financial Resource Strain: Not on file  Food Insecurity: Not on file  Transportation Needs: Not on file  Physical Activity: Not on file  Stress: Not on file  Social Connections: Not on file  Intimate Partner Violence: Not on file    FAMILY HISTORY: Family History  Problem Relation Age of Onset   CAD Paternal Grandfather        "small heart attack" at the age of 14   Prostate cancer Paternal Uncle     ALLERGIES:  is allergic to dilaudid [hydromorphone].  MEDICATIONS:  Current Facility-Administered Medications  Medication Dose  Route Frequency Provider Last Rate Last Admin   0.9 %  sodium chloride infusion   Intravenous PRN Trifan, Kermit Balo, MD       cefTRIAXone (ROCEPHIN) 2 g in sodium chloride 0.9 % 100 mL IVPB  2 g Intravenous Once Couture, Cortni S, PA-C 200 mL/hr at 12/22/20 1751 2 g at 12/22/20 1751   And   metroNIDAZOLE (FLAGYL) IVPB 500 mg  500 mg Intravenous Once Couture, Cortni S, PA-C 100 mL/hr at 12/22/20 1750 500 mg at 12/22/20 1750   ondansetron (ZOFRAN) injection 4 mg  4 mg Intravenous Once PRN Terald Sleeper, MD       Current Outpatient Medications  Medication Sig Dispense Refill   CALCIUM CARBONATE-VITAMIN D PO Take 1 tablet by mouth daily.     Cholecalciferol (VITAMIN D3 PO) Take 1 tablet by mouth daily.     GLUCOSAMINE-CHONDROITIN PO Take 1 tablet by mouth daily.     ibuprofen (ADVIL) 200 MG tablet Take 400 mg by mouth daily as needed (migraine).     Multiple Vitamin (MULTIVITAMIN WITH MINERALS) TABS tablet Take 1 tablet by mouth daily.     Multiple Vitamins-Minerals (ZINC PO) Take 1 tablet by mouth daily.     Probiotic Product (PROBIOTIC PO) Take 1 tablet by mouth daily.      REVIEW OF SYSTEMS:   Constitutional: ( - ) fevers, ( - )  chills , ( - ) night sweats Eyes: ( - ) blurriness of vision, ( - ) double vision, ( - ) watery eyes Ears, nose, mouth, throat, and face: ( - ) mucositis, ( - ) sore throat Respiratory: ( - ) cough, ( - ) dyspnea, ( - ) wheezes Cardiovascular: ( - ) palpitation, ( - ) chest discomfort, ( - ) lower extremity swelling Gastrointestinal:  ( - ) nausea, ( - ) heartburn, ( - ) change in bowel habits Skin: ( - ) abnormal skin rashes Lymphatics: ( - ) new lymphadenopathy, ( - ) easy bruising Neurological: ( - ) numbness, ( - ) tingling, ( - ) new weaknesses Behavioral/Psych: ( - ) mood change, ( - ) new changes  All other systems were reviewed with the patient and are negative.  PHYSICAL EXAMINATION:  Vitals:   12/22/20 1730 12/22/20 1745  BP: 113/68 100/63   Pulse: 79 81  Resp: (!) 22 18  Temp:    SpO2: 97% 94%   Filed Weights   12/22/20 1137  Weight: 137 lb (62.1 kg)    GENERAL: well appearing middle-aged Caucasian female in NAD  SKIN: skin color, texture, turgor are normal, no rashes or significant lesions EYES: conjunctiva are pink and non-injected, sclera clear LUNGS: clear to auscultation and percussion with normal breathing effort HEART: regular rate & rhythm and no murmurs and no lower extremity edema Musculoskeletal: no cyanosis of digits and no clubbing  PSYCH: alert & oriented x 3, fluent speech NEURO: no focal motor/sensory deficits  LABORATORY DATA:  I have reviewed the data as listed CBC Latest Ref Rng & Units 12/22/2020 04/28/2018 04/27/2018  WBC 4.0 - 10.5 K/uL 5.3 6.3 8.1  Hemoglobin 12.0 - 15.0 g/dL 40.9 73.5 32.9  Hematocrit 36.0 - 46.0 % 40.6 38.3 48.6(H)  Platelets 150 - 400 K/uL 264 272 262    CMP Latest Ref Rng & Units 12/22/2020 04/28/2018 04/27/2018  Glucose 70 - 99 mg/dL 924(Q) - 683(M)  BUN 6 - 20 mg/dL 14 - 16  Creatinine 1.96 - 1.00 mg/dL 2.22 9.79 8.92  Sodium 135 - 145 mmol/L 138 - 140  Potassium 3.5 - 5.1 mmol/L 4.0 - 3.4(L)  Chloride 98 - 111 mmol/L 103 - 106  CO2 22 - 32 mmol/L 23 - 24  Calcium 8.9 - 10.3 mg/dL 11.9 - 9.6  Total Protein 6.5 - 8.1 g/dL 7.3 - -  Total Bilirubin 0.3 - 1.2 mg/dL 0.5 - -  Alkaline Phos 38 - 126 U/L 69 - -  AST 15 - 41 U/L 21 - -  ALT 0 - 44 U/L 20 - -     RADIOGRAPHIC STUDIES: I have personally reviewed the radiological images as listed and agreed with the findings in the report. CT ABDOMEN PELVIS W CONTRAST  Result Date: 12/22/2020 CLINICAL DATA:  Right lower quadrant abdominal pain EXAM: CT ABDOMEN AND PELVIS WITH CONTRAST TECHNIQUE: Multidetector CT imaging of the abdomen and pelvis was performed using the standard protocol following bolus administration of intravenous contrast. CONTRAST:  85mL OMNIPAQUE IOHEXOL 350 MG/ML SOLN COMPARISON:  06/03/2007 FINDINGS:  Lower chest: No acute abnormality. Hepatobiliary: Redemonstrated numerous scattered rounded low-density lesions throughout both hepatic lobes, largest within the left hepatic dome which is compatible with a cyst. Majority of the lesions are subcentimeter and too small to definitively characterize, but most likely also represent hepatic cysts. Unremarkable gallbladder. No hyperdense gallstone. No biliary dilatation. Pancreas: Unremarkable. No pancreatic ductal dilatation or surrounding inflammatory changes. Spleen: Normal in size without focal abnormality. Adrenals/Urinary Tract: Unremarkable adrenal glands. There are a few small bilateral renal cysts. No renal stone or hydronephrosis. Urinary bladder is unremarkable for the degree of distension. Stomach/Bowel: The appendix is dilated and fluid-filled with mild adjacent fat stranding (series 2, images 50-59). No periappendiceal fluid collection. No extraluminal air. Small hiatal hernia. Stomach otherwise unremarkable. No dilated loops of bowel to suggest obstruction. Remainder of the colon is within normal limits without focal wall thickening or inflammatory changes. Vascular/Lymphatic: No significant vascular findings are present. No enlarged abdominal or pelvic lymph nodes. Reproductive: Status post hysterectomy. No adnexal masses. Other: No free fluid. No abdominopelvic fluid collection. No pneumoperitoneum. No abdominal wall hernia. Musculoskeletal: No acute or significant osseous findings. IMPRESSION: 1. Dilated and fluid-filled appendix with mild adjacent fat stranding. In the setting of acute abdominal pain, this is felt to most likely represent acute uncomplicated appendicitis. 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 Of note, the appendix was normal in appearance on prior CT from 06/03/2007. 2. Multiple hepatic and bilateral renal cysts. 3. Small hiatal hernia. Electronically Signed   By: Duanne Guess D.O.   On: 12/22/2020 13:35     ASSESSMENT & PLAN IRLANDA CROGHAN 55 y.o. female with medical history significant for von Willebrand disease (reported) who presents with appendicitis and the need for urgent surgery.   After review of the labs, review of the records, and discussion with the patient the patients findings are most consistent with a prior history of von Willebrand's disease.  She notes that  she was "type II" and given her descriptions sounds like type II N.  This would be a deficiency of factor VIII binding to von Willebrand factor.  As such these are typically treated like factor VIII deficiencies.  Typically throughout the course of life von Willebrand disease tends to lessen and factor levels increase.  As such it is possible she has outgrown the initial diagnosis that she received when she was 55 years old.  Today we have ordered von Willebrand factor panels in order to further assess this.  As next precaution recommended we have FFP on standby and I have made recommendations below regarding how to deal with mild to severe bleeding.  After discussion with the patient about the risks and benefits of proceeding with surgery she was agreeable to moving forward.  Hematology will be on standby and I will be really available over the course of her admission for any issues that may arise.  # Reported History of von Willebrand Disease -- Patient reports that she has a history of type II von Willebrand's disease.  She is unsure which variant of type II.  Unlikely to be Type 2B as she has normal platelet counts.  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  She was reportedly diagnosed at [facility] and has not had close hematological follow-up care. --patient has had poor reactions to DDAVP in the past. Recommend avoiding the use of DDAVP --patient has undergone a hysterectomy, 2 knee surgeries, and 3 natural deliveries with no prophylaxis. This is reassuring  that she does not require prophylaxis  --recommending having FFP on hand during her procedure --please order CBC q 6H post operatively.  --if mild post op bleeding that is poorly controlled recommend Tranexamic acid 25mg /kg PO q8H. Can be administered 10mg /kg IV instead --for major bleeding or hemodynamic instability, recommend administering Wilate/Alphanate/or Humate-P 60 VWF:Rco units/kg.  --please call me at [phone] for any concerns/questions regarding bleeding and selection of products to control bleeding.  All questions were answered. The patient knows to call the clinic with any problems, questions or concerns.  A total of more than 50 minutes were spent on this encounter with face-to-face time and non-face-to-face time, including preparing to see the patient, ordering tests and/or medications, counseling the patient and coordination of care as outlined above.   , MD Department of Hematology/Oncology [facility] Phone: [phone] Pager: [phone] Email: [email or web address]  12/22/2020 6:18 PM

## 2020-12-22 NOTE — Transfer of Care (Signed)
Immediate Anesthesia Transfer of Care Note  Patient: Sydney Bell  Procedure(s) Performed: APPENDECTOMY LAPAROSCOPIC  Patient Location: PACU  Anesthesia Type:General  Level of Consciousness: sedated  Airway & Oxygen Therapy: Patient Spontanous Breathing  Post-op Assessment: Report given to RN and Post -op Vital signs reviewed and stable  Post vital signs: Reviewed and stable  Last Vitals:  Vitals Value Taken Time  BP 130/81 12/22/20 2037  Temp    Pulse 106 12/22/20 2040  Resp 14 12/22/20 2040  SpO2 95 % 12/22/20 2040  Vitals shown include unvalidated device data.  Last Pain:  Vitals:   12/22/20 1755  TempSrc:   PainSc: 3          Complications: No notable events documented.

## 2020-12-22 NOTE — Anesthesia Procedure Notes (Signed)
Procedure Name: Intubation Date/Time: 12/22/2020 7:23 PM Performed by: Molli Hazard, CRNA Pre-anesthesia Checklist: Patient identified, Emergency Drugs available, Suction available and Patient being monitored Patient Re-evaluated:Patient Re-evaluated prior to induction Oxygen Delivery Method: Circle system utilized Preoxygenation: Pre-oxygenation with 100% oxygen Induction Type: IV induction, Rapid sequence and Cricoid Pressure applied Laryngoscope Size: Glidescope and Miller Grade View: Grade I Tube size: 7.0 mm Number of attempts: 3 Airway Equipment and Method: Stylet Dental Injury: Teeth and Oropharynx as per pre-operative assessment  Comments: DL w/ miller 2 by TG, CRNA; esophageal intubation. DL w/ miller 2 by Dr. Mal Amabile; esophageal intubation. Mask ventilated. DL with glidescope by Dr. Mal Amabile, Gr 1 view, tracheal intubation.

## 2020-12-22 NOTE — ED Notes (Signed)
Provider at bedside obtaining consent for surgery.

## 2020-12-22 NOTE — ED Provider Notes (Signed)
[facility] EMERGENCY DEPT Provider Note   CSN: [id number] Arrival date & time: 12/22/20  1113     History Chief Complaint  Patient presents with   Abdominal Pain    Sydney Bell is a 55 y.o. female.  HPI  55 year old female with a history of von Willebrand's disease, presents to the emergency department today for evaluation of abdominal pain.  Patient states abdominal pain woke her up this morning around 3 AM.  Pain has been constant since onset is not improved with Pepto-Bismol or positional changes.  She has had some associated nausea but denies any vomiting diarrhea or constipation.  Last BM was this a.m.  She denies any urinary symptoms and there have been no reported fevers.  Past Medical History:  Diagnosis Date   Von Willebrand disease [facility])     Patient Active Problem List   Diagnosis Date Noted   Palpitations 04/28/2018   Left-sided chest pain 04/27/2018    Past Surgical History:  Procedure Laterality Date   ABDOMINAL HYSTERECTOMY     KNEE SURGERY       OB History   No obstetric history on file.     Family History  Problem Relation Age of Onset   CAD Paternal Grandfather        "small heart attack" at the age of 27   Prostate cancer Paternal Uncle     Social History   Tobacco Use   Smoking status: Never   Smokeless tobacco: Never  Vaping Use   Vaping Use: Never used  Substance Use Topics   Alcohol use: Never   Drug use: Never    Home Medications Prior to Admission medications   Medication Sig Start Date End Date Taking? Authorizing Provider  acidophilus (RISAQUAD) CAPS capsule Take 1 capsule by mouth daily.    [provider]  Ascorbic Acid (VITAMIN C) 1000 MG tablet Take 1,000 mg by mouth daily.    [provider]  calcium-vitamin D (OSCAL WITH D) 500-200 MG-UNIT tablet Take 1 tablet by mouth daily.    [provider]  glucosamine-chondroitin 500-400 MG tablet Take 1 tablet by mouth daily.     [provider]  metoprolol succinate (TOPROL-XL) 25 MG 24 hr tablet Take 0.5 tablets (12.5 mg total) by mouth daily. 04/29/18   Burnadette Pop, MD  Multiple Vitamin (MULTIVITAMIN WITH MINERALS) TABS tablet Take 1 tablet by mouth daily.    [provider]    Allergies    Patient has no known allergies.  Review of Systems   Review of Systems  Constitutional:  Negative for fever.  HENT:  Negative for ear pain and sore throat.   Eyes:  Negative for visual disturbance.  Respiratory:  Negative for cough and shortness of breath.   Cardiovascular:  Negative for chest pain.  Gastrointestinal:  Positive for abdominal pain and nausea. Negative for constipation, diarrhea and vomiting.  Genitourinary:  Negative for dysuria and hematuria.  Musculoskeletal:  Negative for back pain.  Skin:  Negative for rash.  Neurological:  Negative for seizures and syncope.  All other systems reviewed and are negative.  Physical Exam Updated Vital Signs BP 128/84 (BP Location: Right Arm)   Pulse (!) 104   Temp 98 F (36.7 C) (Oral)   Resp 16   Ht 5' (1.524 m)   Wt 62.1 kg   SpO2 99%   BMI 26.76 kg/m   Physical Exam Vitals and nursing note reviewed.  Constitutional:      General:  She is not in acute distress.    Appearance: She is well-developed.  HENT:     Head: Normocephalic and atraumatic.  Eyes:     Conjunctiva/sclera: Conjunctivae normal.  Cardiovascular:     Rate and Rhythm: Normal rate and regular rhythm.     Heart sounds: Normal heart sounds. No murmur heard. Pulmonary:     Effort: Pulmonary effort is normal. No respiratory distress.     Breath sounds: Normal breath sounds. No wheezing, rhonchi or rales.  Abdominal:     Palpations: Abdomen is soft.     Tenderness: There is abdominal tenderness in the right upper quadrant, right lower quadrant and epigastric area. There is guarding (RLQ). There is no rebound.  Musculoskeletal:     Cervical back: Neck supple.  Skin:     General: Skin is warm and dry.  Neurological:     Mental Status: She is alert.    ED Results / Procedures / Treatments   Labs (all labs ordered are listed, but only abnormal results are displayed) Labs Reviewed  COMPREHENSIVE METABOLIC PANEL - Abnormal; Notable for the following components:      Result Value   Glucose, Bld 126 (*)    All other components within normal limits  URINALYSIS, ROUTINE W REFLEX MICROSCOPIC - Abnormal; Notable for the following components:   Hgb urine dipstick SMALL (*)    Ketones, ur 15 (*)    Leukocytes,Ua SMALL (*)    All other components within normal limits  RESP PANEL BY RT-PCR (FLU A&B, COVID) ARPGX2  LIPASE, BLOOD  CBC    EKG None  Radiology CT ABDOMEN PELVIS W CONTRAST  Result Date: 12/22/2020 CLINICAL DATA:  Right lower quadrant abdominal pain EXAM: CT ABDOMEN AND PELVIS WITH CONTRAST TECHNIQUE: Multidetector CT imaging of the abdomen and pelvis was performed using the standard protocol following bolus administration of intravenous contrast. CONTRAST:  16mL OMNIPAQUE IOHEXOL 350 MG/ML SOLN COMPARISON:  06/03/2007 FINDINGS: Lower chest: No acute abnormality. Hepatobiliary: Redemonstrated numerous scattered rounded low-density lesions throughout both hepatic lobes, largest within the left hepatic dome which is compatible with a cyst. Majority of the lesions are subcentimeter and too small to definitively characterize, but most likely also represent hepatic cysts. Unremarkable gallbladder. No hyperdense gallstone. No biliary dilatation. Pancreas: Unremarkable. No pancreatic ductal dilatation or surrounding inflammatory changes. Spleen: Normal in size without focal abnormality. Adrenals/Urinary Tract: Unremarkable adrenal glands. There are a few small bilateral renal cysts. No renal stone or hydronephrosis. Urinary bladder is unremarkable for the degree of distension. Stomach/Bowel: The appendix is dilated and fluid-filled with mild adjacent fat stranding  (series 2, images 50-59). No periappendiceal fluid collection. No extraluminal air. Small hiatal hernia. Stomach otherwise unremarkable. No dilated loops of bowel to suggest obstruction. Remainder of the colon is within normal limits without focal wall thickening or inflammatory changes. Vascular/Lymphatic: No significant vascular findings are present. No enlarged abdominal or pelvic lymph nodes. Reproductive: Status post hysterectomy. No adnexal masses. Other: No free fluid. No abdominopelvic fluid collection. No pneumoperitoneum. No abdominal wall hernia. Musculoskeletal: No acute or significant osseous findings. IMPRESSION: 1. Dilated and fluid-filled appendix with mild adjacent fat stranding. In the setting of acute abdominal pain, this is felt to most likely represent acute uncomplicated appendicitis. 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 Of note, the appendix was normal in appearance on prior CT from 06/03/2007. 2. Multiple hepatic and bilateral renal cysts. 3. Small hiatal hernia. Electronically Signed   By: Duanne Guess D.O.   On: 12/22/2020  13:35    Procedures Procedures   Medications Ordered in ED Medications  ondansetron (ZOFRAN) injection 4 mg (has no administration in time range)  cefTRIAXone (ROCEPHIN) 2 g in sodium chloride 0.9 % 100 mL IVPB (has no administration in time range)    And  metroNIDAZOLE (FLAGYL) IVPB 500 mg (has no administration in time range)  sodium chloride 0.9 % bolus 1,000 mL (1,000 mLs Intravenous New Bag/Given 12/22/20 1327)  ondansetron (ZOFRAN) injection 4 mg (4 mg Intravenous Given 12/22/20 1256)  ketorolac (TORADOL) 30 MG/ML injection 30 mg (30 mg Intravenous Given 12/22/20 1256)  iohexol (OMNIPAQUE) 350 MG/ML injection 80 mL (80 mLs Intravenous Contrast Given 12/22/20 1305)    ED Course  I have reviewed the triage vital signs and the nursing notes.  Pertinent labs & imaging results that were available during my care of the patient were  reviewed by me and considered in my medical decision making (see chart for details).    MDM Rules/Calculators/A&P                          55 y/o F here with abd pain.   Reviewed/interpreted labs CBC w/o leukocytosis or anemia CMP unremarkable Lipase negative UA with hematuria, ketonuria, small leukocytes, 21-50 RBC.   Reviewed/interpreted imaging CT abd/pelvis -  1. Dilated and fluid-filled appendix with mild adjacent fat stranding. In the setting of acute abdominal pain, this is felt to most likely represent acute uncomplicated appendicitis. 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 Of note, the appendix was normal in appearance on prior CT from 06/03/2007. 2. Multiple hepatic and bilateral renal cysts. 3. Small hiatal hernia.   1:56 PM CONSULT with Dr. Donell Beers who recommends ED to ED transfer and to have general surgery paged on patients arrival. Recommends abx and tx like appendicitis.   Discussed dx and plan with pt and husband at bedside. Offered to send via [facility] however pt prefers to go POV. She understands not to stop anywhere or eat anything on the way. She agrees to go directly to [facility] ED.   2:05 PM Discussed case with Dr. Rodena Medin at [facility] ED who accept patient for transfer     Final Clinical Impression(s) / ED Diagnoses Final diagnoses:  Appendicitis, unspecified appendicitis type    Rx / DC Orders ED Discharge Orders     None        Rayne Du 12/22/20 1434    Terald Sleeper, MD 12/22/20 1718

## 2020-12-22 NOTE — Anesthesia Preprocedure Evaluation (Addendum)
Anesthesia Evaluation  Patient identified by MRN, date of birth, ID band Patient awake    Reviewed: Allergy & Precautions, NPO status , Patient's Chart, lab work & pertinent test results  History of Anesthesia Complications Negative for: history of anesthetic complications  Airway Mallampati: I  TM Distance: >3 FB Neck ROM: Full    Dental  (+) Dental Advisory Given, Teeth Intact   Pulmonary neg pulmonary ROS,    Pulmonary exam normal        Cardiovascular Normal cardiovascular exam+ dysrhythmias (hx tachycardia/palpitations briefly on metoprolol in the past, no longer taking)      Neuro/Psych negative neurological ROS  negative psych ROS   GI/Hepatic negative GI ROS, Neg liver ROS,   Endo/Other  negative endocrine ROS  Renal/GU negative Renal ROS     Musculoskeletal negative musculoskeletal ROS (+)   Abdominal   Peds  Hematology  (+) Blood dyscrasia, ,  Von Willebrand disease - per patient, type 2. Never required blood transfusions for previous surgeries (including hysterectomy) or vaginal deliveries.     Anesthesia Other Findings   Reproductive/Obstetrics                            Anesthesia Physical Anesthesia Plan  ASA: 2 and emergent  Anesthesia Plan: General   Post-op Pain Management:    Induction: Intravenous, Rapid sequence and Cricoid pressure planned  PONV Risk Score and Plan: 3 and Treatment may vary due to age or medical condition, Ondansetron, Dexamethasone and Midazolam  Airway Management Planned: Oral ETT  Additional Equipment: None  Intra-op Plan:   Post-operative Plan: Extubation in OR  Informed Consent: I have reviewed the patients History and Physical, chart, labs and discussed the procedure including the risks, benefits and alternatives for the proposed anesthesia with the patient or authorized representative who has indicated his/her understanding and  acceptance.     Dental advisory given  Plan Discussed with: CRNA and Anesthesiologist  Anesthesia Plan Comments:        Anesthesia Quick Evaluation

## 2020-12-22 NOTE — ED Notes (Signed)
MD at bedside. 

## 2020-12-22 NOTE — ED Provider Notes (Signed)
[facility] with [facility]icitis, Dr. Donell Beers was consulted prehospital at the outside facility, I have examined the patient in person, she appears uncomfortable and has tenderness in the right lower quadrant, reviewed CT scan showing acute appendicitis.  Will consult with general surgery to let them know the patient is here, she is n.p.o. since 3:30 AM, pain medication or   Eber Hong, MD 12/22/20 [phone]

## 2020-12-22 NOTE — ED Notes (Signed)
Pt started having jaw pain and throat pain after dilaudid administration. Pt not tolerating dilaudid well. Added to allergy list as intolerance. Notified Hyacinth Meeker MD.

## 2020-12-22 NOTE — ED Triage Notes (Signed)
Mid abd pain woke pt from sleep this morning. No N/V/D burning pain.

## 2020-12-22 NOTE — H&P (Signed)
Sydney Bell is an 55 y.o. female.   Chief Complaint: abdominal pain HPI: Pt is a 55 yo F who presents with around 12 hours of severe abdominal pain that awoke her from sleep around 3 AM today.  She has tried multiple over the counter medications as well as    and trying different positions. She has never  had pain like this before.  She has a decreased appetite.  She is nauseated, but hasn't vomited. She really did not want to come in, but when the pain kept getting worse, she came in.   She thinks she has had some mild discomfort in the same area on and off for a few weeks, but it was nothing like this severity.    Of note, she carries a dx of Von Willebrand disease.  This occurred based on bloodwork when she was much younger.  She received a dose of DDAVP when it was less mainstream and had severe hypertension and a dysphoric episode in which she felt that she couldn't talk.  She has subsequently had 3 children, two knee surgeries and a hysterectomy without requiring anything for bleeding.    Past Medical History:  Diagnosis Date   Von Willebrand disease (HCC)     Past Surgical History:  Procedure Laterality Date   ABDOMINAL HYSTERECTOMY     KNEE SURGERY      Family History  Problem Relation Age of Onset   CAD Paternal Grandfather        "small heart attack" at the age of 55   Prostate cancer Paternal Uncle    Social History:  reports that she has never smoked. She has never used smokeless tobacco. She reports that she does not drink alcohol and does not use drugs.  Allergies: No Known Allergies  No outpatient medications have been marked as taking for the 12/22/20 encounter [facility] Encounter).     Results for orders placed or performed during the hospital encounter of 12/22/20 (from the past 48 hour(s))  Lipase, blood     Status: None   Collection Time: 12/22/20 12:03 PM  Result Value Ref Range   Lipase 14 11 - 51 U/L    Comment: Performed at [facility], [address] [place], [place] [address]  Comprehensive metabolic panel     Status: Abnormal   Collection Time: 12/22/20 12:03 PM  Result Value Ref Range   Sodium 138 135 - 145 mmol/L   Potassium 4.0 3.5 - 5.1 mmol/L   Chloride 103 98 - 111 mmol/L   CO2 23 22 - 32 mmol/L   Glucose, Bld 126 (H) 70 - 99 mg/dL    Comment: Glucose reference range applies only to samples taken after fasting for at least 8 hours.   BUN 14 6 - 20 mg/dL   Creatinine, Ser 0.76 0.44 - 1.00 mg/dL   Calcium 15.1 8.9 - 83.4 mg/dL   Total Protein 7.3 6.5 - 8.1 g/dL   Albumin 4.6 3.5 - 5.0 g/dL   AST 21 15 - 41 U/L   ALT 20 0 - 44 U/L   Alkaline Phosphatase 69 38 - 126 U/L   Total Bilirubin 0.5 0.3 - 1.2 mg/dL   GFR, Estimated >37 >35 mL/min    Comment: (NOTE) Calculated using the CKD-EPI Creatinine Equation (2021)    Anion gap 12 5 - 15    Comment: Performed at [facility], [address], [place], [place] [address]  CBC     Status:  None   Collection Time: 12/22/20 12:03 PM  Result Value Ref Range   WBC 5.3 4.0 - 10.5 K/uL   RBC 4.66 3.87 - 5.11 MIL/uL   Hemoglobin 13.8 12.0 - 15.0 g/dL   HCT 86.5 78.4 - 69.6 %   MCV 87.1 80.0 - 100.0 fL   MCH 29.6 26.0 - 34.0 pg   MCHC 34.0 30.0 - 36.0 g/dL   RDW 29.5 28.4 - 13.2 %   Platelets 264 150 - 400 K/uL   nRBC 0.0 0.0 - 0.2 %    Comment: Performed at [facility], [address], [place], [place] [address]  Urinalysis, Routine w reflex microscopic Urine, Clean Catch     Status: Abnormal   Collection Time: 12/22/20 12:04 PM  Result Value Ref Range   Color, Urine YELLOW YELLOW   APPearance CLEAR CLEAR   Specific Gravity, Urine 1.011 1.005 - 1.030   pH 6.0 5.0 - 8.0   Glucose, UA NEGATIVE NEGATIVE mg/dL   Hgb urine dipstick SMALL (A) NEGATIVE   Bilirubin Urine NEGATIVE NEGATIVE   Ketones, ur 15 (A) NEGATIVE mg/dL   Protein, ur NEGATIVE NEGATIVE mg/dL   Nitrite NEGATIVE NEGATIVE   Leukocytes,Ua  SMALL (A) NEGATIVE   RBC / HPF 21-50 0 - 5 RBC/hpf   WBC, UA 0-5 0 - 5 WBC/hpf   Squamous Epithelial / LPF 0-5 0 - 5   Mucus PRESENT    Budding Yeast PRESENT     Comment: Performed at [facility], [address], [place], [place] [address]  Resp Panel by RT-PCR (Flu A&B, Covid) Nasopharyngeal Swab     Status: None   Collection Time: 12/22/20  2:10 PM   Specimen: Nasopharyngeal Swab; Nasopharyngeal(NP) swabs in vial transport medium  Result Value Ref Range   SARS Coronavirus 2 by RT PCR NEGATIVE NEGATIVE    Comment: (NOTE) SARS-CoV-2 target nucleic acids are NOT DETECTED.  The SARS-CoV-2 RNA is generally detectable in upper respiratory specimens during the acute phase of infection. The lowest concentration of SARS-CoV-2 viral copies this assay can detect is 138 copies/mL. A negative result does not preclude SARS-Cov-2 infection and should not be used as the sole basis for treatment or other patient management decisions. A negative result may occur with  improper specimen collection/handling, submission of specimen other than nasopharyngeal swab, presence of viral mutation(s) within the areas targeted by this assay, and inadequate number of viral copies(<138 copies/mL). A negative result must be combined with clinical observations, patient history, and epidemiological information. The expected result is Negative.  Fact Sheet for Patients:  BloggerCourse.com  Fact Sheet for Healthcare Providers:  SeriousBroker.it  This test is no t yet approved or cleared by the [place] FDA and  has been authorized for detection and/or diagnosis of SARS-CoV-2 by FDA under an Emergency Use Authorization (EUA). This EUA will remain  in effect (meaning this test can be used) for the duration of the COVID-19 declaration under Section 564(b)(1) of the Act, 21 U.S.C.section 360bbb-3(b)(1), unless the authorization is terminated   or revoked sooner.       Influenza A by PCR NEGATIVE NEGATIVE   Influenza B by PCR NEGATIVE NEGATIVE    Comment: (NOTE) The Xpert Xpress SARS-CoV-2/FLU/RSV plus assay is intended as an aid in the diagnosis of influenza from Nasopharyngeal swab specimens and should not be used as a sole basis for treatment. Nasal washings and aspirates are unacceptable for Xpert Xpress SARS-CoV-2/FLU/RSV testing.  Fact Sheet for Patients: BloggerCourse.com  Fact Sheet for Healthcare Providers: SeriousBroker.it  This test is not yet approved or cleared by the [place] FDA and has been authorized for detection and/or diagnosis of SARS-CoV-2 by FDA under an Emergency Use Authorization (EUA). This EUA will remain in effect (meaning this test can be used) for the duration of the COVID-19 declaration under Section 564(b)(1) of the Act, 21 U.S.C. section 360bbb-3(b)(1), unless the authorization is terminated or revoked.  Performed at [facility], [address] [place], [place] [address]    CT ABDOMEN PELVIS W CONTRAST  Result Date: 12/22/2020 CLINICAL DATA:  Right lower quadrant abdominal pain EXAM: CT ABDOMEN AND PELVIS WITH CONTRAST TECHNIQUE: Multidetector CT imaging of the abdomen and pelvis was performed using the standard protocol following bolus administration of intravenous contrast. CONTRAST:  57mL OMNIPAQUE IOHEXOL 350 MG/ML SOLN COMPARISON:  06/03/2007 FINDINGS: Lower chest: No acute abnormality. Hepatobiliary: Redemonstrated numerous scattered rounded low-density lesions throughout both hepatic lobes, largest within the left hepatic dome which is compatible with a cyst. Majority of the lesions are subcentimeter and too small to definitively characterize, but most likely also represent hepatic cysts. Unremarkable gallbladder. No hyperdense gallstone. No biliary dilatation. Pancreas: Unremarkable. No pancreatic ductal  dilatation or surrounding inflammatory changes. Spleen: Normal in size without focal abnormality. Adrenals/Urinary Tract: Unremarkable adrenal glands. There are a few small bilateral renal cysts. No renal stone or hydronephrosis. Urinary bladder is unremarkable for the degree of distension. Stomach/Bowel: The appendix is dilated and fluid-filled with mild adjacent fat stranding (series 2, images 50-59). No periappendiceal fluid collection. No extraluminal air. Small hiatal hernia. Stomach otherwise unremarkable. No dilated loops of bowel to suggest obstruction. Remainder of the colon is within normal limits without focal wall thickening or inflammatory changes. Vascular/Lymphatic: No significant vascular findings are present. No enlarged abdominal or pelvic lymph nodes. Reproductive: Status post hysterectomy. No adnexal masses. Other: No free fluid. No abdominopelvic fluid collection. No pneumoperitoneum. No abdominal wall hernia. Musculoskeletal: No acute or significant osseous findings. IMPRESSION: 1. Dilated and fluid-filled appendix with mild adjacent fat stranding. In the setting of acute abdominal pain, this is felt to most likely represent acute uncomplicated appendicitis. 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 Of note, the appendix was normal in appearance on prior CT from 06/03/2007. 2. Multiple hepatic and bilateral renal cysts. 3. Small hiatal hernia. Electronically Signed   By: Duanne Guess D.O.   On: 12/22/2020 13:35    Review of Systems  All other systems reviewed and are negative.  Blood pressure 128/84, pulse (!) 104, temperature 98 F (36.7 C), temperature source Oral, resp. rate 16, height 5' (1.524 m), weight 62.1 kg, SpO2 99 %.  Physical Exam Vitals reviewed.  Constitutional:      General: She is not in acute distress.    Appearance: She is well-developed and normal weight. She is not ill-appearing, toxic-appearing or diaphoretic.  HENT:     Head: Normocephalic  and atraumatic.     Mouth/Throat:     Mouth: Mucous membranes are moist.  Eyes:     General: No scleral icterus.    Extraocular Movements: Extraocular movements intact.     Pupils: Pupils are equal, round, and reactive to light.  Cardiovascular:     Rate and Rhythm: Normal rate and regular rhythm.     Heart sounds: Normal heart sounds. No murmur heard.   No friction rub. No gallop.  Pulmonary:     Effort: Pulmonary effort is normal. No respiratory distress.     Breath  sounds: Normal breath sounds. No stridor. No wheezing, rhonchi or rales.  Abdominal:     General: Abdomen is flat. Bowel sounds are decreased. There is no distension. There are no signs of injury.     Palpations: Abdomen is soft. There is no shifting dullness, fluid wave, hepatomegaly, splenomegaly or mass.     Tenderness: There is abdominal tenderness in the right lower quadrant. Positive signs include McBurney's sign.     Hernia: No hernia is present.  Skin:    General: Skin is warm and dry.     Capillary Refill: Capillary refill takes 2 to 3 seconds.     Coloration: Skin is not jaundiced or pale.     Findings: No rash.  Neurological:     General: No focal deficit present.     Mental Status: She is alert and oriented to person, place, and time.  Psychiatric:        Mood and Affect: Mood normal. Mood is not anxious or depressed.        Behavior: Behavior normal.      Assessment/Plan Acute appendicitis Von Willebrand's disease  Consulted heme onc regarding the von willebrand's disease to assess what measures need to be in place for surgery. Dr. Leonides Schanz does not recommend prophylactic tx with ffp, VWF, or DDAVP.   IV fluids IV antibiotics Surgery.  Pt desires to go home after surgery if she feels able.    Appendectomy was described to the patient and her spouse.  The incisions and surgical technique were explained.  The patient was advised that some of the hair on the abdomen would be clipped, and that a foley  catheter would be placed.  I advised the patient of the risks of surgery including, but not limited to, bleeding, infection, damage to other structures, risk of an open operation, risk of abscess, and risk of blood clot.  The recovery was also described to the patient.  She was advised that she will have lifting restrictions for 2 weeks.     Almond Lint, MD 12/22/2020, 4:39 PM

## 2020-12-22 NOTE — Op Note (Signed)
Appendectomy, Lap, Procedure Note  Indications: The patient presented with a history of right-sided abdominal pain. A CT revealed findings consistent with acute appendicitis vs mucocele.  Given her acute pain, it was treated as acute appendicitis.  Pre-operative Diagnosis: acute appendicitis  Post-operative Diagnosis: early acute appendicitis with mucocele  Surgeon: Almond Lint, MD  Assistants: n/a  Anesthesia: General endotracheal anesthesia and Local anesthesia 1% plain lidocaine, 0.25.% bupivacaine, with epinephrine  ASA Class: 2, E  Procedure Details  The patient was seen again in the Holding Room. The risks, benefits, complications, treatment options, and expected outcomes were discussed with the patient and/or family. The possibilities of perforation of viscus, bleeding, recurrent infection, the need for additional procedures, failure to diagnose a condition, and creating a complication requiring transfusion or operation were discussed. There was concurrence with the proposed plan and informed consent was obtained. The site of surgery was properly noted. The patient was taken to Operating Room, identified as Merdis Delay and the procedure verified as Appendectomy. A Time Out was held and the above information confirmed.  The patient was placed in the supine position and general anesthesia was induced, along with placement of orogastric tube, Venodyne boots, and a Foley catheter. The abdomen was prepped and draped in a sterile fashion. Local anesthetic was infiltrated in the infraumbilical region.  A 1.5 cm curvilinear incision was made just below the umbilicus at her prior incision.  The Kelly clamp was used to spread the subcutaneous tissues.  The fascia was elevated with 2 Kocher clamps and incised with the #11 blade.  A Tresa Endo was used to confirm entrance into the peritoneal cavity.  A pursestring suture was placed around the fascial incision.  The Hasson trocar was inserted into the  abdomen and held in place with the tails of the suture.  The pneumoperitoneum was then established to steady pressure of 15 mmHg.     Additional 5 mm cannulas then placed in the left lower quadrant of the abdomen and the suprapubic region under direct visualization.  A careful evaluation of the entire abdomen was carried out. The patient was placed in Trendelenburg and rotated to the left.  The small intestines were retracted in the cephalad and left lateral direction away from the pelvis and right lower quadrant. The patient was found to have an enlarged appendix with mild inflammation in the pelvic position. There was no evidence of perforation.  The appendix was carefully dissected. The appendix was was skeletonized with the harmonic scalpel.   The appendix was divided at its base using an endo-GIA stapler. Minimal appendiceal stump was left in place. The appendix was removed from the abdomen with an Endocatch bag through the umbilical port. There was some bleeding at the staple line and this was clipped.  Afterward, there was no evidence of bleeding, leakage, or complication after division of the appendix. Irrigation was also performed and irrigate suctioned from the abdomen as well.  The 5 mm trocars were removed.  The pneumoperitoneum was evacuated from the abdomen.    The trocar site skin wounds were closed with 4-0 Monocryl and dressed with Dermabond.  Instrument, sponge, and needle counts were correct at the conclusion of the case.   Findings: The appendix was found to be mildly inflamed. There were not signs of necrosis.  There was not perforation. There was not abscess formation. After closure of the wounds along with dressing of the wounds, the appendix was opened on the backtable and was full of mucin.  Estimated Blood Loss:  Minimal         Drains: none          Specimens: appendix to pathology         Complications:  None; patient tolerated the procedure well.          Disposition: PACU - hemodynamically stable.         Condition: stable

## 2020-12-22 NOTE — ED Notes (Addendum)
Hematology at bedside

## 2020-12-23 ENCOUNTER — Encounter (HOSPITAL_COMMUNITY): Payer: Self-pay | Admitting: General Surgery

## 2020-12-23 NOTE — Anesthesia Postprocedure Evaluation (Signed)
Anesthesia Post Note  Patient: [name]  Procedure(s) Performed: APPENDECTOMY LAPAROSCOPIC     Patient location during evaluation: PACU Anesthesia Type: General Level of consciousness: awake and alert Pain management: pain level controlled Vital Signs Assessment: post-procedure vital signs reviewed and stable Respiratory status: spontaneous breathing, nonlabored ventilation, respiratory function stable and patient connected to nasal cannula oxygen Cardiovascular status: stable and blood pressure returned to baseline Anesthetic complications: no   No notable events documented.  Last Vitals:  Vitals:   12/22/20 2123 12/22/20 2130  BP: 130/67   Pulse: 84   Resp: 16   Temp:  36.7 C  SpO2: 90%     Last Pain:  Vitals:   12/22/20 2130  TempSrc:   PainSc: 0-No pain                 Beryle Lathe

## 2020-12-24 LAB — VON WILLEBRAND PANEL
Coagulation Factor VIII: 134 % (ref 56–140)
Ristocetin Co-factor, Plasma: 86 % (ref 50–200)
Von Willebrand Antigen, Plasma: 94 % (ref 50–200)

## 2020-12-24 LAB — SURGICAL PATHOLOGY

## 2020-12-24 LAB — COAG STUDIES INTERP REPORT

## 2020-12-25 ENCOUNTER — Telehealth: Payer: Self-pay | Admitting: General Surgery

## 2020-12-25 NOTE — Telephone Encounter (Signed)
Attempted to call patient about pathology.  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  Negative margins, no rupture. Will not need additional treatment. Extraordinarily low risk of recurrence given good pathology (T1)

## 2020-12-28 LAB — VON WILLEBRAND FACTOR MULTIMER

## 2021-01-01 ENCOUNTER — Telehealth: Payer: Self-pay | Admitting: *Deleted

## 2021-01-01 NOTE — Telephone Encounter (Signed)
LM with note below. To call if she has any questions or concerns

## 2021-01-01 NOTE — Telephone Encounter (Signed)
-----   Message from Kyra Searles, RN sent at 01/01/2021  2:46 PM EDT -----  ----- Message ----- From: Jaci Standard, MD Sent: 12/30/2020   9:58 AM EDT To: Kyra Searles, RN  Please let Mrs. Kuzel know that results of her von Willebrand testing has returned. We were not able to detect any abnormalities. Her Von Willebrand activity, levels, and Factor VIII levels were all normal. In certain cases of vWD patients do "grow out of it". It is possible she no longer has any deficiency. Please let her know that there is no need for routine f/u in our clinic, but if she were to have any issues with bleeding or any additional surgeries we are happy to be available for recommendations.   ----- Message ----- From: Leory Plowman, [facility] [place] Sent: 12/24/2020   3:36 PM EDT To: Jaci Standard, MD

## 2022-01-25 IMAGING — CT CT ABD-PELV W/ CM
2 of 5 series · 16 of 46 positions shown, 18 images · IV contrast (omnipaque)
Comparison: 06/03/2007

CLINICAL DATA: Right lower quadrant abdominal pain

EXAM:
CT ABDOMEN AND PELVIS WITH CONTRAST
TECHNIQUE: Multidetector CT imaging of the abdomen and pelvis was performed
using the standard protocol following bolus administration of
intravenous contrast.
CONTRAST:  80mL OMNIPAQUE IOHEXOL 350 MG/ML SOLN

[Series 2: abd pel w · axial · 0.85mm/px · z∈[+689,+1054]mm · 13 of 83 slices shown, 15 images]
[im 5/83  soft-tissue]
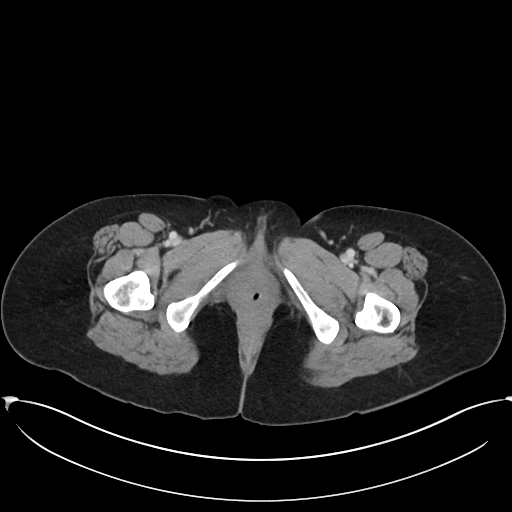
[im 5/83  bone]
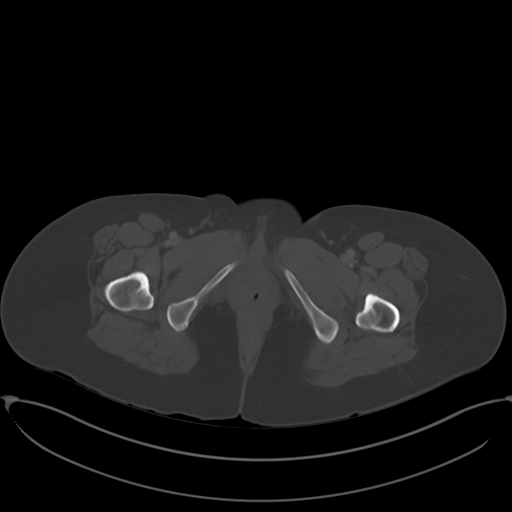
[im 13/83  soft-tissue]
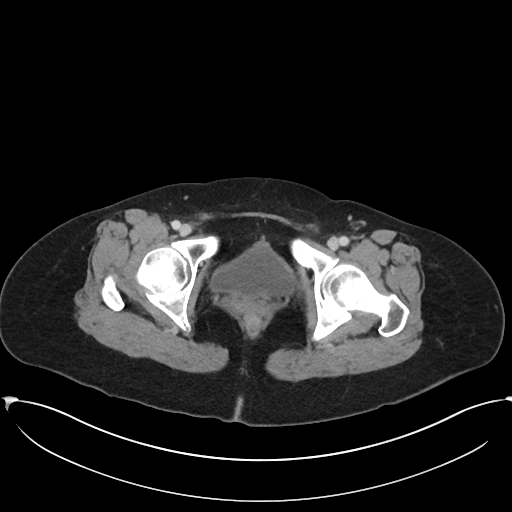
[im 18/83  soft-tissue]
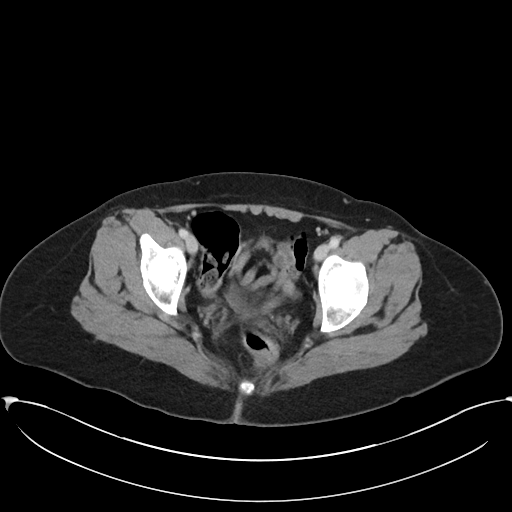
[im 22/83  soft-tissue]
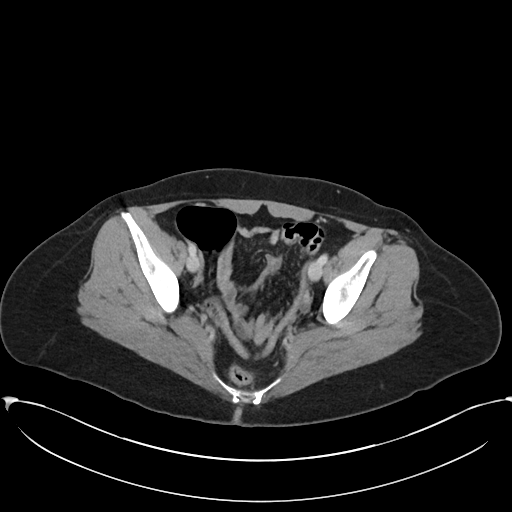
[im 31/83  soft-tissue]
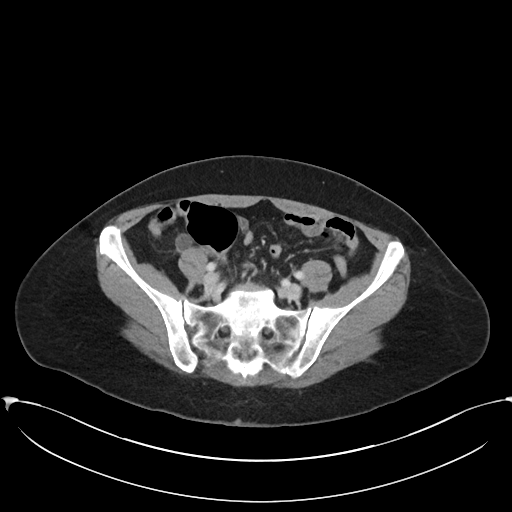
[im 35/83  soft-tissue]
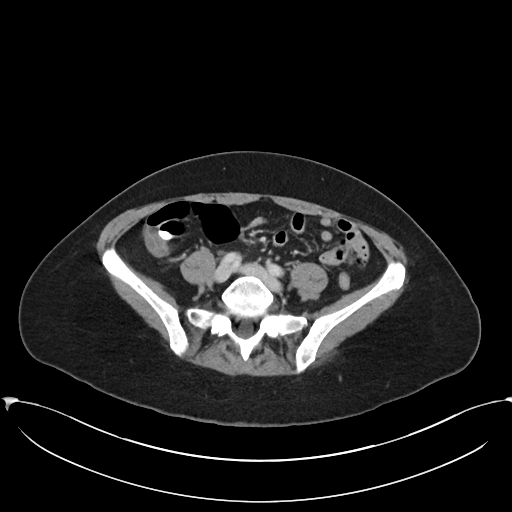
[im 44/83  soft-tissue]
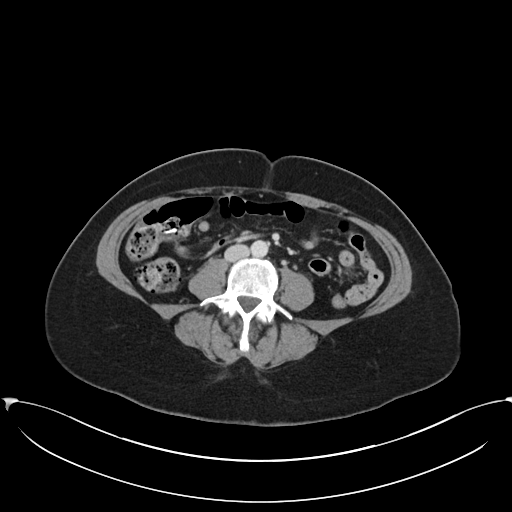
[im 48/83  soft-tissue]
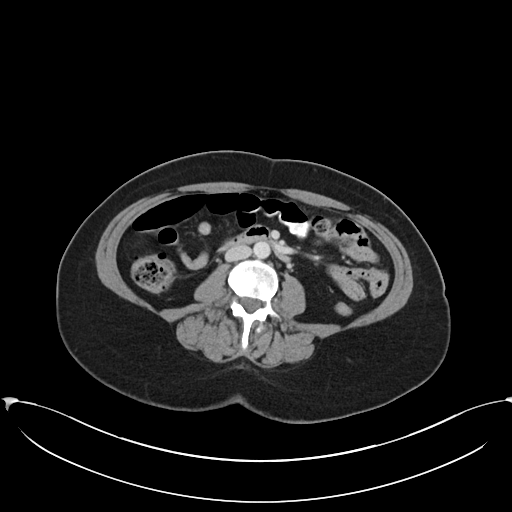
[im 52/83  soft-tissue]
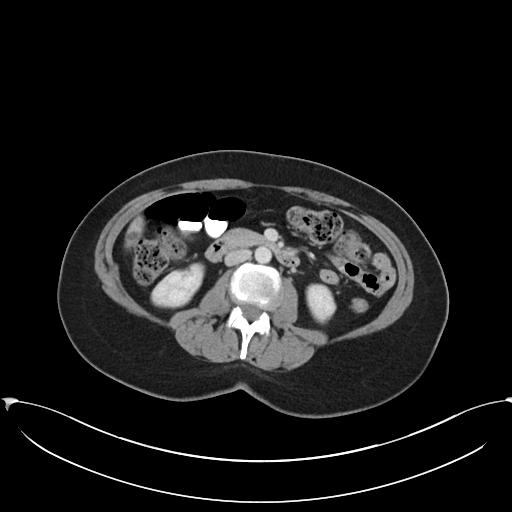
[im 52/83  bone]
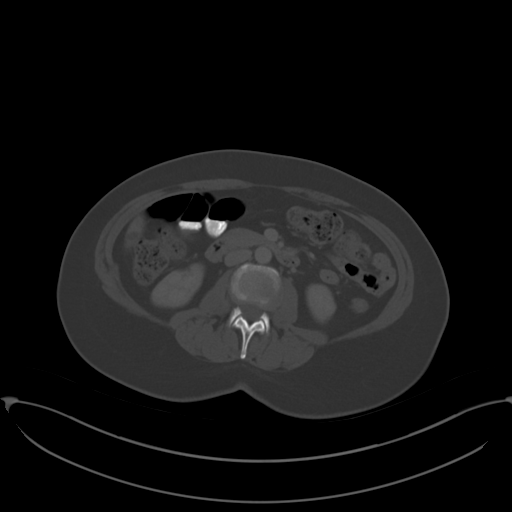
[im 61/83  soft-tissue]
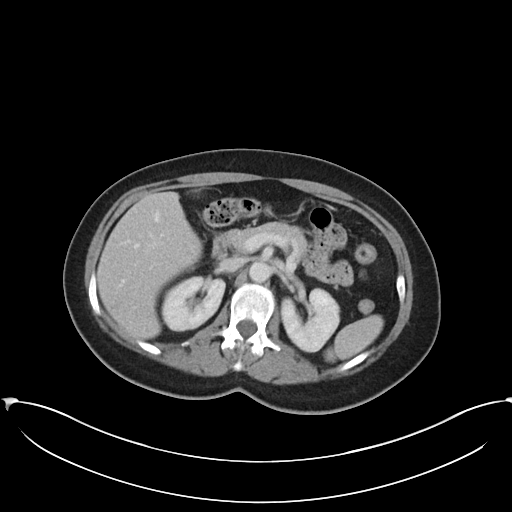
[im 65/83  soft-tissue]
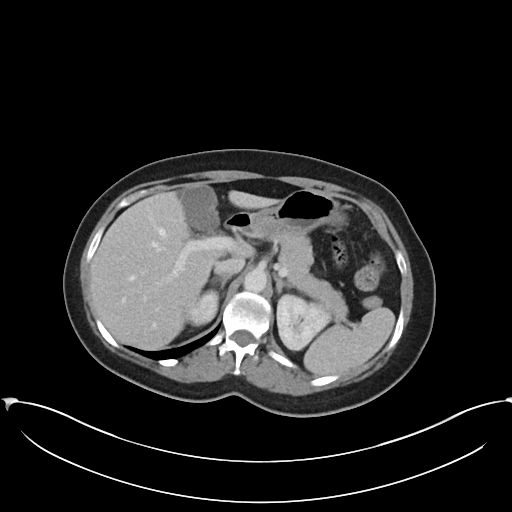
[im 70/83  soft-tissue]
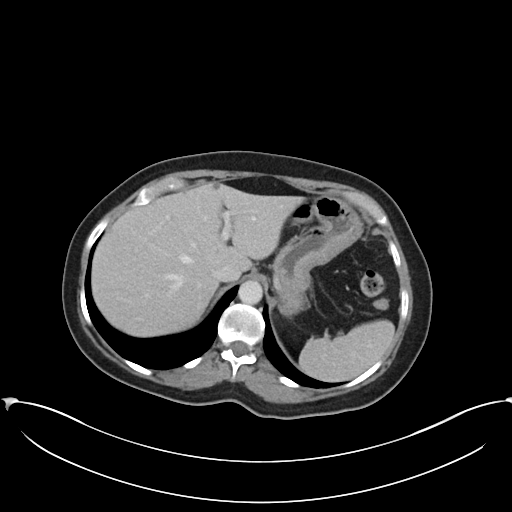
[im 78/83  soft-tissue]
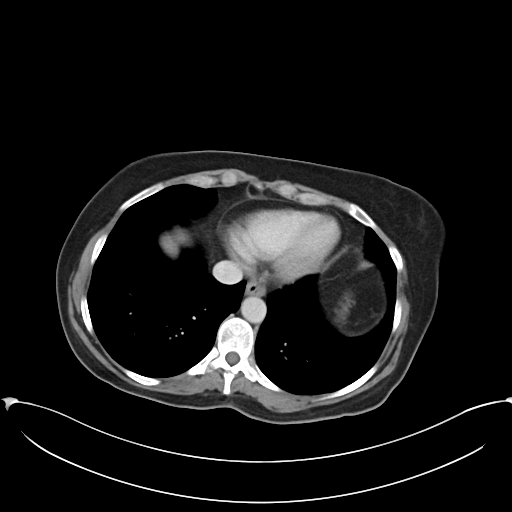

[Series 5: coronal · coronal · 0.84mm/px · 3 of 83 slices shown]
[im 28/83  soft-tissue]
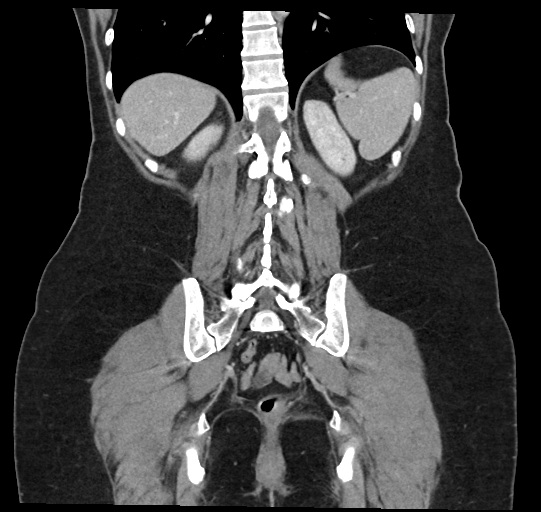
[im 37/83  soft-tissue]
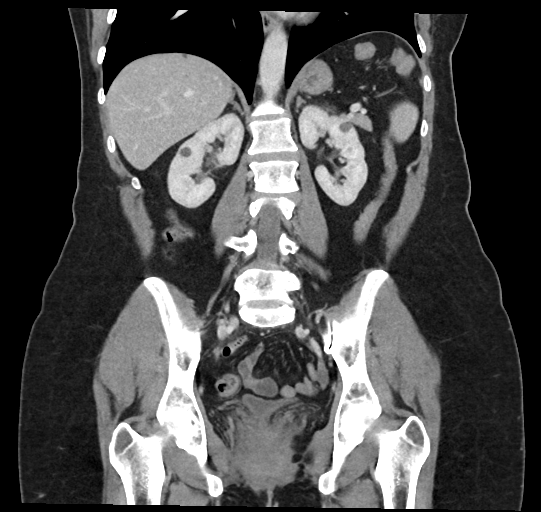
[im 46/83  soft-tissue]
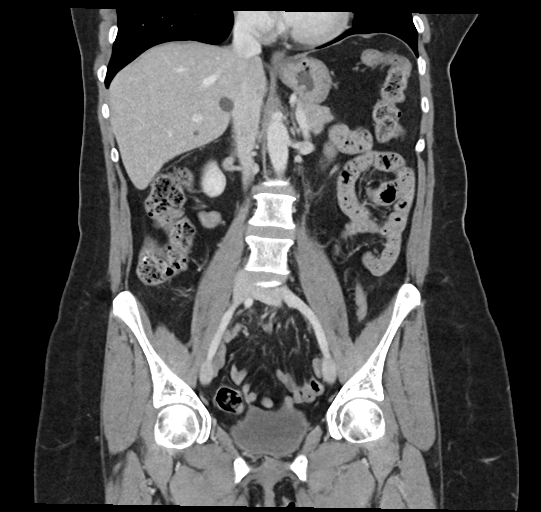

[16 of 46 positions shown; findings below may reference images not displayed]

FINDINGS: Lower chest: No acute abnormality.

Hepatobiliary: Redemonstrated numerous scattered rounded low-density
lesions throughout both hepatic lobes, largest within the left
hepatic dome which is compatible with a cyst. Majority of the
lesions are subcentimeter and too small to definitively
characterize, but most likely also represent hepatic cysts.
Unremarkable gallbladder. No hyperdense gallstone. No biliary
dilatation.

Pancreas: Unremarkable. No pancreatic ductal dilatation or
surrounding inflammatory changes.

Spleen: Normal in size without focal abnormality.

Adrenals/Urinary Tract: Unremarkable adrenal glands. There are a few
small bilateral renal cysts. No renal stone or hydronephrosis.
Urinary bladder is unremarkable for the degree of distension.

Stomach/Bowel: The appendix is dilated and fluid-filled with mild
adjacent fat stranding (series 2, images 50-59). No periappendiceal
fluid collection. No extraluminal air. Small hiatal hernia. Stomach
otherwise unremarkable. No dilated loops of bowel to suggest
obstruction. Remainder of the colon is within normal limits without
focal wall thickening or inflammatory changes.

Vascular/Lymphatic: No significant vascular findings are present. No
enlarged abdominal or pelvic lymph nodes.

Reproductive: Status post hysterectomy. No adnexal masses.

Other: No free fluid. No abdominopelvic fluid collection. No
pneumoperitoneum. No abdominal wall hernia.

Musculoskeletal: No acute or significant osseous findings.
IMPRESSION: 1. Dilated and fluid-filled appendix with mild adjacent fat
stranding. In the setting of acute abdominal pain, this is felt to
most likely represent acute uncomplicated appendicitis. Appendiceal
mucocele could also have this appearance. Of note, the appendix was
normal in appearance on prior CT from 06/03/2007.
[DATE]. Multiple hepatic and bilateral renal cysts.
3. Small hiatal hernia.

## 2023-10-15 ENCOUNTER — Encounter: Payer: Self-pay | Admitting: Advanced Practice Midwife
